# Patient Record
Sex: Female | Born: 1975
Health system: Southern US, Community
[De-identification: ages and names within clinical notes are randomized; demographics above are authoritative.]

## PROBLEM LIST (undated history)

## (undated) DIAGNOSIS — N920 Excessive and frequent menstruation with regular cycle: Secondary | ICD-10-CM

## (undated) DIAGNOSIS — N946 Dysmenorrhea, unspecified: Secondary | ICD-10-CM

## (undated) DIAGNOSIS — F32A Depression, unspecified: Secondary | ICD-10-CM

## (undated) DIAGNOSIS — D219 Benign neoplasm of connective and other soft tissue, unspecified: Secondary | ICD-10-CM

## (undated) DIAGNOSIS — N83209 Unspecified ovarian cyst, unspecified side: Secondary | ICD-10-CM

## (undated) DIAGNOSIS — F329 Major depressive disorder, single episode, unspecified: Secondary | ICD-10-CM

## (undated) DIAGNOSIS — N63 Unspecified lump in unspecified breast: Secondary | ICD-10-CM

## (undated) DIAGNOSIS — R32 Unspecified urinary incontinence: Secondary | ICD-10-CM

## (undated) DIAGNOSIS — F419 Anxiety disorder, unspecified: Secondary | ICD-10-CM

## (undated) DIAGNOSIS — Z87442 Personal history of urinary calculi: Secondary | ICD-10-CM

## (undated) HISTORY — DX: Depression, unspecified: F32.A

## (undated) HISTORY — DX: Unspecified ovarian cyst, unspecified side: N83.209

## (undated) HISTORY — DX: Anxiety disorder, unspecified: F41.9

## (undated) HISTORY — DX: Unspecified urinary incontinence: R32

## (undated) HISTORY — DX: Unspecified lump in unspecified breast: N63.0

## (undated) HISTORY — DX: Benign neoplasm of connective and other soft tissue, unspecified: D21.9

## (undated) HISTORY — PX: OOPHORECTOMY: SHX86

## (undated) HISTORY — PX: BREAST CYST EXCISION: SHX579

## (undated) HISTORY — DX: Excessive and frequent menstruation with regular cycle: N92.0

## (undated) HISTORY — PX: TUBAL LIGATION: SHX77

## (undated) HISTORY — DX: Major depressive disorder, single episode, unspecified: F32.9

## (undated) HISTORY — DX: Dysmenorrhea, unspecified: N94.6

## (undated) HISTORY — PX: APPENDECTOMY: SHX54

---

## 2000-12-21 ENCOUNTER — Encounter: Payer: Self-pay | Admitting: *Deleted

## 2000-12-21 ENCOUNTER — Emergency Department (HOSPITAL_COMMUNITY): Admission: EM | Admit: 2000-12-21 | Discharge: 2000-12-22 | Payer: Self-pay | Admitting: Emergency Medicine

## 2001-05-13 ENCOUNTER — Other Ambulatory Visit: Admission: RE | Admit: 2001-05-13 | Discharge: 2001-05-13 | Payer: Self-pay | Admitting: Obstetrics and Gynecology

## 2005-07-14 ENCOUNTER — Ambulatory Visit (HOSPITAL_COMMUNITY): Admission: RE | Admit: 2005-07-14 | Discharge: 2005-07-14 | Payer: Self-pay | Admitting: General Surgery

## 2008-12-28 ENCOUNTER — Ambulatory Visit: Payer: Self-pay | Admitting: Family Medicine

## 2008-12-28 DIAGNOSIS — R5383 Other fatigue: Secondary | ICD-10-CM

## 2008-12-28 DIAGNOSIS — R5381 Other malaise: Secondary | ICD-10-CM | POA: Insufficient documentation

## 2008-12-28 DIAGNOSIS — F17200 Nicotine dependence, unspecified, uncomplicated: Secondary | ICD-10-CM | POA: Insufficient documentation

## 2008-12-28 DIAGNOSIS — R32 Unspecified urinary incontinence: Secondary | ICD-10-CM | POA: Insufficient documentation

## 2008-12-29 ENCOUNTER — Encounter: Payer: Self-pay | Admitting: Family Medicine

## 2009-01-01 LAB — CONVERTED CEMR LAB
ALT: 11 units/L (ref 0–35)
AST: 10 units/L (ref 0–37)
Albumin: 4.4 g/dL (ref 3.5–5.2)
Alkaline Phosphatase: 41 units/L (ref 39–117)
BUN: 15 mg/dL (ref 6–23)
Basophils Absolute: 0 10*3/uL (ref 0.0–0.1)
Basophils Relative: 1 % (ref 0–1)
Bilirubin, Direct: 0.1 mg/dL (ref 0.0–0.3)
CO2: 24 meq/L (ref 19–32)
Calcium: 9.2 mg/dL (ref 8.4–10.5)
Chloride: 105 meq/L (ref 96–112)
Cholesterol: 188 mg/dL (ref 0–200)
Creatinine, Ser: 0.69 mg/dL (ref 0.40–1.20)
Eosinophils Absolute: 0.2 10*3/uL (ref 0.0–0.7)
Eosinophils Relative: 3 % (ref 0–5)
Glucose, Bld: 84 mg/dL (ref 70–99)
HCT: 40.8 % (ref 36.0–46.0)
HDL: 45 mg/dL (ref >39)
Hemoglobin: 13.2 g/dL (ref 12.0–15.0)
Indirect Bilirubin: 0.5 mg/dL (ref 0.0–0.9)
LDL Cholesterol: 128 mg/dL — ABNORMAL HIGH (ref 0–99)
Lymphocytes Relative: 37 % (ref 12–46)
Lymphs Abs: 2.1 10*3/uL (ref 0.7–4.0)
MCHC: 32.4 g/dL (ref 30.0–36.0)
MCV: 92.5 fL (ref 78.0–100.0)
Monocytes Absolute: 0.4 10*3/uL (ref 0.1–1.0)
Monocytes Relative: 6 % (ref 3–12)
Neutro Abs: 3 10*3/uL (ref 1.7–7.7)
Neutrophils Relative %: 53 % (ref 43–77)
Platelets: 247 10*3/uL (ref 150–400)
Potassium: 4.4 meq/L (ref 3.5–5.3)
RBC: 4.41 M/uL (ref 3.87–5.11)
RDW: 13.3 % (ref 11.5–15.5)
Sodium: 140 meq/L (ref 135–145)
TSH: 0.762 microintl units/mL (ref 0.350–4.500)
Total Bilirubin: 0.6 mg/dL (ref 0.3–1.2)
Total CHOL/HDL Ratio: 4.2
Total Protein: 6.6 g/dL (ref 6.0–8.3)
Triglycerides: 76 mg/dL (ref <150)
VLDL: 15 mg/dL (ref 0–40)
WBC: 5.6 10*3/uL (ref 4.0–10.5)

## 2009-01-04 ENCOUNTER — Ambulatory Visit (HOSPITAL_COMMUNITY): Admission: RE | Admit: 2009-01-04 | Discharge: 2009-01-04 | Payer: Self-pay | Admitting: Family Medicine

## 2009-12-04 ENCOUNTER — Other Ambulatory Visit: Admission: RE | Admit: 2009-12-04 | Discharge: 2009-12-04 | Payer: Self-pay | Admitting: Family Medicine

## 2009-12-04 ENCOUNTER — Ambulatory Visit: Payer: Self-pay | Admitting: Family Medicine

## 2009-12-11 ENCOUNTER — Encounter: Payer: Self-pay | Admitting: Family Medicine

## 2009-12-11 LAB — CONVERTED CEMR LAB: Pap Smear: NEGATIVE

## 2010-04-14 ENCOUNTER — Encounter: Payer: Self-pay | Admitting: General Surgery

## 2010-04-19 ENCOUNTER — Telehealth: Payer: Self-pay | Admitting: Family Medicine
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
## 2010-04-23 NOTE — Letter (Signed)
Summary: Letter  Letter   Imported By: Lind Guest 12/11/2009 11:27:50  _____________________________________________________________________  External Attachment:    Type:   Image     Comment:   External Document

## 2010-04-25 NOTE — Progress Notes (Signed)
  Phone Note Other Incoming   Caller: dr Nobie Putnam Summary of Call: pt has dental abcess, needs antibiotic, will not need fluconazole, has appt next week with the dentist, will send in 10 day supply Initial call taken by: Syliva Overman MD,  April 19, 2010 10:25 AM    New/Updated Medications: AMOXICILLIN 500 MG CAPS (AMOXICILLIN) Take 1 capsule by mouth three times a day Prescriptions: AMOXICILLIN 500 MG CAPS (AMOXICILLIN) Take 1 capsule by mouth three times a day  #30 x 0   Entered and Authorized by:   Syliva Overman MD   Signed by:   Syliva Overman MD on 04/19/2010   Method used:   Electronically to        [facility] (retail)       [address]       [place], [place]  [address]       Ph: [phone]       Fax: [phone]   RxID:   [id number]

## 2011-01-06 ENCOUNTER — Ambulatory Visit (INDEPENDENT_AMBULATORY_CARE_PROVIDER_SITE_OTHER): Payer: 59 | Admitting: Family Medicine

## 2011-01-06 ENCOUNTER — Ambulatory Visit (HOSPITAL_COMMUNITY)
Admission: RE | Admit: 2011-01-06 | Discharge: 2011-01-06 | Disposition: A | Discharge status: Home / Self Care | Payer: 59 | Source: Ambulatory Visit | Attending: Family Medicine | Admitting: Family Medicine

## 2011-01-06 ENCOUNTER — Encounter: Payer: Self-pay | Admitting: Family Medicine

## 2011-01-06 DIAGNOSIS — R079 Chest pain, unspecified: Secondary | ICD-10-CM | POA: Insufficient documentation

## 2011-01-06 DIAGNOSIS — M542 Cervicalgia: Secondary | ICD-10-CM

## 2011-01-06 DIAGNOSIS — M549 Dorsalgia, unspecified: Secondary | ICD-10-CM

## 2011-01-06 DIAGNOSIS — F17200 Nicotine dependence, unspecified, uncomplicated: Secondary | ICD-10-CM

## 2011-01-06 DIAGNOSIS — R32 Unspecified urinary incontinence: Secondary | ICD-10-CM

## 2011-01-06 MED ORDER — PREDNISONE (PAK) 5 MG PO TABS: 5.0000 mg | ORAL_TABLET | ORAL | Status: AC

## 2011-01-06 MED ORDER — IBUPROFEN 600 MG PO TABS: ORAL_TABLET | ORAL | Status: DC

## 2011-01-06 MED ORDER — RANITIDINE HCL 150 MG PO TABS: 150.0000 mg | ORAL_TABLET | Freq: Two times a day (BID) | ORAL | Status: DC

## 2011-01-06 MED ORDER — CYCLOBENZAPRINE HCL 5 MG PO TABS: 5.0000 mg | ORAL_TABLET | Freq: Three times a day (TID) | ORAL | Status: DC | PRN

## 2011-01-06 MED ORDER — TOLTERODINE TARTRATE ER 4 MG PO CP24: 4.0000 mg | ORAL_CAPSULE | Freq: Every day | ORAL | Status: DC

## 2011-01-06 NOTE — Assessment & Plan Note (Signed)
unchnaged °

## 2011-01-06 NOTE — Patient Instructions (Signed)
F/u in 5 months .  You are being treated with anti inflammatories and muscle relaxant for back pain and spasm. X rays are ordered of your neck and upper back .  Med is sent in for incontinence.  Call if no improvement, you may benefit from physical therapy, if pain persists you will need an mRI   Please think about quitting smoking.  This is very important for your health.  Consider setting a quit date, then cutting back or switching brands to prepare to stop.  Also think of the money you will save every day by not smoking.  Quick Tips to Quit Smoking: Fix a date i.e. keep a date in mind from when you would not touch a tobacco product to smoke  Keep yourself busy and block your mind with work loads or reading books or watching movies in malls where smoking is not allowed  Vanish off the things which reminds you about smoking for example match box, or your favorite lighter, or the pipe you used for smoking, or your favorite jeans and shirt with which you used to enjoy smoking, or the club where you used to do smoking  Try to avoid certain people places and incidences where and with whom smoking is a common factor to add on  Praise yourself with some token gifts from the money you saved by stopping smoking  Anti Smoking teams are there to help you. Join their programs  Anti-smoking Gums are there in many medical shops. Try them to quit smoking   Side-effects of Smoking: Disease caused by smoking cigarettes are emphysema, bronchitis, heart failures  Premature death  Cancer is the major side effect of smoking  Heart attacks and strokes are the quick effects of smoking causing sudden death  Some smokers lives end up with limbs amputated  Breathing problem or fast breathing is another side effect of smoking  Due to more intakes of smokes, carbon mono-oxide goes into your brain and other muscles of the body which leads to swelling of the veins and blockage to the air passage to lungs  Carbon  monoxide blocks blood vessels which leads to blockage in the flow of blood to different major body organs like heart lungs and thus leads to attacks and deaths  During pregnancy smoking is very harmful and leads to premature birth of the infant, spontaneous abortions, low weight of the infant during birth  Fat depositions to narrow and blocked blood vessels causing heart attacks  In many cases cigarette smoking caused infertility in men

## 2011-01-06 NOTE — Assessment & Plan Note (Signed)
deteriorted requests med

## 2011-01-06 NOTE — Progress Notes (Signed)
  Subjective:    Patient ID: Anna Gonzalez, female    DOB: 1975-10-09, 35 y.o.   MRN: [id number]  HPI 2 week h/o neck and upper back pain, no aggravating factor noted to bring it on, increased discomfort with upper body movement.Woke in tears 3 days ago Has had this in the past  No plans to quit tobacco at this time.  Still has urge and stress incontinence requests med be renewed   Review of Systems See HPI Denies recent fever or chills. Denies sinus pressure, nasal congestion, ear pain or sore throat. Denies chest congestion, productive cough or wheezing. Denies chest pains, palpitations and leg swelling Denies abdominal pain, nausea, vomiting,diarrhea or constipation.   Denies dysuria.  Denies headaches, seizures, numbness, or tingling. Denies depression, anxiety or insomnia. Denies skin break down or rash.        Objective:   Physical Exam Patient alert and oriented and in no cardiopulmonary distress.Pt in pain  HEENT: No facial asymmetry, EOMI, no sinus tenderness,  oropharynx pink and moist.  Neck supple no adenopathy.  Chest: Clear to auscultation bilaterally.Decreased air entry throughout  CVS: S1, S2 no murmurs, no S3.  ABD: Soft non tender. Bowel sounds normal.  Ext: No edema  MS: Decreased ROM spine,adequate in  shoulders, hips and knees.  Skin: Intact, no ulcerations or rash noted.  Psych: Good eye contact, normal affect. Memory intact not anxious or depressed appearing.  CNS: CN 2-12 intact, power, tone and sensation normal throughout.        Assessment & Plan:

## 2011-01-12 ENCOUNTER — Encounter: Payer: Self-pay | Admitting: Family Medicine

## 2011-01-12 NOTE — Assessment & Plan Note (Signed)
Acute flare, will treat with anti inflammatories and muscle relaxants, films ordered, if no improvement needs Pt and may need MRI scan

## 2011-01-12 NOTE — Assessment & Plan Note (Addendum)
Acute episode of pain , symptomatic treatment only at this time as well as films

## 2011-01-14 ENCOUNTER — Telehealth: Payer: Self-pay | Admitting: Family Medicine

## 2011-01-14 NOTE — Telephone Encounter (Signed)
pls advise mild curving/scoliosis of mid back, neck x ray looked normal(I tried to call, no answer)

## 2011-01-16 ENCOUNTER — Ambulatory Visit: Payer: Self-pay | Admitting: Family Medicine

## 2011-01-17 NOTE — Telephone Encounter (Signed)
Patient aware.

## 2011-06-12 ENCOUNTER — Encounter: Payer: Self-pay | Admitting: Family Medicine

## 2011-06-12 ENCOUNTER — Encounter: Payer: 59 | Admitting: Family Medicine

## 2011-11-28 ENCOUNTER — Encounter (HOSPITAL_COMMUNITY): Payer: Self-pay

## 2011-11-28 ENCOUNTER — Emergency Department (HOSPITAL_COMMUNITY)
Admission: EM | Admit: 2011-11-28 | Discharge: 2011-11-28 | Disposition: A | Discharge status: Home / Self Care | Payer: 59 | Attending: Emergency Medicine | Admitting: Emergency Medicine

## 2011-11-28 DIAGNOSIS — M542 Cervicalgia: Secondary | ICD-10-CM | POA: Insufficient documentation

## 2011-11-28 DIAGNOSIS — F17200 Nicotine dependence, unspecified, uncomplicated: Secondary | ICD-10-CM | POA: Insufficient documentation

## 2011-11-28 DIAGNOSIS — M62838 Other muscle spasm: Secondary | ICD-10-CM | POA: Insufficient documentation

## 2011-11-28 MED ORDER — IBUPROFEN 600 MG PO TABS: 600.0000 mg | ORAL_TABLET | Freq: Four times a day (QID) | ORAL | Status: AC | PRN

## 2011-11-28 MED ORDER — HYDROCODONE-ACETAMINOPHEN 5-325 MG PO TABS: 1.0000 | ORAL_TABLET | ORAL | Status: AC | PRN

## 2011-11-28 MED ORDER — CYCLOBENZAPRINE HCL 5 MG PO TABS: 5.0000 mg | ORAL_TABLET | Freq: Three times a day (TID) | ORAL | Status: AC | PRN

## 2011-11-28 NOTE — ED Notes (Signed)
Pt states she was sitting at her desk, stretched and left side of neck popped

## 2011-11-28 NOTE — ED Provider Notes (Signed)
History     CSN: [id number]  Arrival date & time 11/28/11  0913   First MD Initiated Contact with Patient 11/28/11 [phone]      Chief Complaint  Patient presents with  . Neck Pain    (Consider location/radiation/quality/duration/timing/severity/associated sxs/prior treatment) HPI Comments: Anna Gonzalez presents with sudden onset of left sided neck pain and muscle spasm after stretching at her desk this morning with her hands over her head.  She does report a history of chronic intermittent pain in her neck and felt for the past several days that she was developing another episode of spasm.  She denies pain, numbness or tingling in her upper extremities,  But she does describe a slightly "funny" sensation in her left forearm.  She denies weakness.  She has tried no medications prior to arrival.  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  Movement makes her pain worse,  Especially attempts to rotate her head over her right shoulder.  The history is provided by the patient.    History reviewed. No pertinent past medical history.  Past Surgical History  Procedure Date  . Oophorectomy   . Appendectomy   . Breast cyst excision     Family History  Problem Relation Age of Onset  . Cancer Mother     History  Substance Use Topics  . Smoking status: Current Everyday Smoker -- 1.0 packs/day  . Smokeless tobacco: Not on file  . Alcohol Use: Yes     occasionally    OB History    Grav Para Term Preterm Abortions TAB SAB Ect Mult Living                  Review of Systems  Constitutional: Negative for fever.  HENT: Positive for neck pain and neck stiffness.   Respiratory: Negative for shortness of breath.   Cardiovascular: Negative for chest pain.  Skin: Negative for rash and wound.  Neurological: Negative for weakness, light-headedness and numbness.    Allergies  Review of patient's allergies indicates no known allergies.  Home Medications    Current Outpatient Rx  Name Route Sig Dispense Refill  . BAYER BACK & BODY PAIN EX ST PO Oral Take 2 tablets by mouth daily as needed. Pain.    . CYCLOBENZAPRINE HCL 5 MG PO TABS Oral Take 1 tablet (5 mg total) by mouth 3 (three) times daily as needed for muscle spasms. 15 tablet 0  . HYDROCODONE-ACETAMINOPHEN 5-325 MG PO TABS Oral Take 1 tablet by mouth every 4 (four) hours as needed for pain. 15 tablet 0  . IBUPROFEN 600 MG PO TABS Oral Take 1 tablet (600 mg total) by mouth every 6 (six) hours as needed for pain. 15 tablet 0    BP 119/70  Pulse 73  Temp 98.1 F (36.7 C) (Oral)  Resp 21  Ht 5\' 5"  (1.651 m)  Wt 130 lb (58.968 kg)  BMI 21.63 kg/m2  SpO2 100%  LMP 11/21/2011  Physical Exam  Nursing note and vitals reviewed. Constitutional: She appears well-developed and well-nourished.  HENT:  Head: Normocephalic.  Eyes: Conjunctivae are normal.  Neck: Muscular tenderness present. No spinous process tenderness present. No rigidity. Decreased range of motion present. No edema present.    Cardiovascular: Normal rate and intact distal pulses.   Pulmonary/Chest: Effort normal.  Abdominal: Soft. Bowel sounds are normal. She exhibits no distension and no mass.  Musculoskeletal: She exhibits no edema.  Lumbar back: She exhibits tenderness. She exhibits no swelling, no edema and no spasm.  Neurological: She is alert. She has normal strength. She displays no atrophy and no tremor. No sensory deficit. She exhibits normal muscle tone. Gait normal.  Reflex Scores:      Bicep reflexes are 2+ on the right side and 2+ on the left side.      No strength deficit noted in upper extremities.  Equal grip strength.  Skin: Skin is warm and dry.  Psychiatric: She has a normal mood and affect.    ED Course  Procedures (including critical care time)  Labs Reviewed - No data to display No results found.   1. Cervical paraspinous muscle spasm       MDM  Patient prescribed Flexeril,  ibuprofen and hydrocodone.  Caution given regarding sedating effects of hydrocodone.  She is also encouraged to use a heating pad 15 minutes 4 times daily followed by gentle range of motion.  Encouraged to followup with her PCP for recheck if not improved over the next week.  Also encouraged to get prompt recheck if she develops any weakness in her upper extremities.  Prior x-rays reviewed including normal complete C-spine with flexion and extension x-rays 12/12.    No neuro deficit on exam or by history to suggest emergent or surgical presentation.  Also discussed worsened sx that should prompt immediate re-evaluation including distal weakness, bowel/bladder retention/incontinence.              Burgess Amor, PA 11/28/11 0955  Burgess Amor, PA 11/28/11 [phone]

## 2011-11-28 NOTE — ED Provider Notes (Signed)
Medical screening examination/treatment/procedure(s) were performed by non-physician practitioner and as supervising physician I was immediately available for consultation/collaboration.   Joya Gaskins, MD 11/28/11 1409

## 2012-04-07 ENCOUNTER — Other Ambulatory Visit: Payer: Self-pay | Admitting: Family Medicine

## 2013-01-31 ENCOUNTER — Encounter (HOSPITAL_COMMUNITY): Payer: Self-pay | Admitting: Emergency Medicine

## 2013-01-31 ENCOUNTER — Emergency Department (HOSPITAL_COMMUNITY)
Admission: EM | Admit: 2013-01-31 | Discharge: 2013-01-31 | Disposition: A | Discharge status: Home / Self Care | Payer: 59 | Attending: Emergency Medicine | Admitting: Emergency Medicine

## 2013-01-31 DIAGNOSIS — F17200 Nicotine dependence, unspecified, uncomplicated: Secondary | ICD-10-CM | POA: Insufficient documentation

## 2013-01-31 DIAGNOSIS — M436 Torticollis: Secondary | ICD-10-CM | POA: Insufficient documentation

## 2013-01-31 MED ORDER — METHOCARBAMOL 500 MG PO TABS: 1000.0000 mg | ORAL_TABLET | Freq: Four times a day (QID) | ORAL | Status: DC

## 2013-01-31 MED ORDER — HYDROCODONE-ACETAMINOPHEN 5-325 MG PO TABS: 1.0000 | ORAL_TABLET | ORAL | Status: DC | PRN

## 2013-01-31 MED ORDER — HYDROCODONE-ACETAMINOPHEN 5-325 MG PO TABS
1.0000 | ORAL_TABLET | Freq: Once | ORAL | Status: AC
  Administered 2013-01-31: 1 via ORAL
  Filled 2013-01-31: qty 1

## 2013-01-31 MED ORDER — METHOCARBAMOL 500 MG PO TABS
1000.0000 mg | ORAL_TABLET | Freq: Once | ORAL | Status: AC
  Administered 2013-01-31: 1000 mg via ORAL
  Filled 2013-01-31: qty 2

## 2013-01-31 MED ORDER — METHOCARBAMOL 500 MG PO TABS: 1000.0000 mg | ORAL_TABLET | Freq: Four times a day (QID) | ORAL | Status: AC

## 2013-01-31 NOTE — ED Notes (Signed)
Pain in neck and rt  Shoulder Onset this am when stretching.

## 2013-02-02 NOTE — ED Provider Notes (Signed)
CSN: [id number]     Arrival date & time 01/31/13  1034 History   First MD Initiated Contact with Patient 01/31/13 1123     Chief Complaint  Patient presents with  . Neck Pain   (Consider location/radiation/quality/duration/timing/severity/associated sxs/prior Treatment) HPI Comments: [name] is a 37 y.o. Female presenting with sudden onset of pain and muscle spasm of her right neck and upper shoulder.  She stood up from her desk to stretch and had sudden onset of symptoms.  She does have a history of intermittent similar symptoms which she blames on stress and is usually relieved by muscle relaxers.  She denies weakness, numbness or tingling in her upper extremities.  Additionally she denies dizziness and shortness of breath, fevers, uri type symptoms,  Denies headache and visual changes.     The history is provided by the patient.    History reviewed. No pertinent past medical history. Past Surgical History  Procedure Laterality Date  . Oophorectomy    . Appendectomy    . Breast cyst excision    . Cesarean section     Family History  Problem Relation Age of Onset  . Cancer Mother    History  Substance Use Topics  . Smoking status: Current Every Day Smoker -- 1.00 packs/day  . Smokeless tobacco: Not on file  . Alcohol Use: Yes     Comment: occasionally   OB History   Grav Para Term Preterm Abortions TAB SAB Ect Mult Living                 Review of Systems  Constitutional: Negative for fever and chills.  Eyes: Negative for visual disturbance.  Respiratory: Negative for shortness of breath.   Cardiovascular: Negative for chest pain and leg swelling.  Gastrointestinal: Negative for abdominal pain, constipation and abdominal distention.  Genitourinary: Negative for dysuria, urgency, frequency, flank pain and difficulty urinating.  Musculoskeletal: Positive for neck pain and neck stiffness. Negative for gait problem and joint swelling.  Skin: Negative for rash.   Neurological: Negative for weakness and numbness.    Allergies  Review of patient's allergies indicates no known allergies.  Home Medications   Current Outpatient Rx  Name  Route  Sig  Dispense  Refill  . Aspirin-Caffeine (BAYER BACK & BODY PAIN EX ST PO)   Oral   Take 2 tablets by mouth daily as needed. Pain.         Marland Kitchen HYDROcodone-acetaminophen (NORCO/VICODIN) 5-325 MG per tablet   Oral   Take 1 tablet by mouth every 4 (four) hours as needed for moderate pain.   20 tablet   0   . methocarbamol (ROBAXIN) 500 MG tablet   Oral   Take 2 tablets (1,000 mg total) by mouth 4 (four) times daily.   40 tablet   0    BP 114/63  Pulse 73  Temp(Src) 98 F (36.7 C) (Oral)  Resp 20  Ht 5\' 4"  (1.626 m)  Wt 125 lb (56.7 kg)  BMI 21.45 kg/m2  SpO2 100%  LMP 01/20/2013 Physical Exam  Constitutional: She appears well-developed and well-nourished.  HENT:  Head: Normocephalic and atraumatic.  Eyes: EOM are normal. Pupils are equal, round, and reactive to light.  Neck: Muscular tenderness present. No spinous process tenderness present. Decreased range of motion present. No tracheal deviation present.  Pt sitting very erect,  relunctant to move head.  She can rotate over left shoulder with mild discomfort.  Increased pain with rightward rotation and reduced  ROM.  Equal grip strength.  Radial pulses intact.  ttp right trapezius with spasm.  Cardiovascular:  Pulses equal bilaterally  Pulmonary/Chest: No stridor.  Neurological: She is alert. She has normal strength. She displays normal reflexes. No sensory deficit.  Reflex Scores:      Bicep reflexes are 2+ on the right side and 2+ on the left side. Equal strength  Skin: Skin is warm and dry.  Psychiatric: She has a normal mood and affect.    ED Course  Procedures (including critical care time) Labs Review Labs Reviewed - No data to display Imaging Review No results found.  EKG Interpretation   None       MDM   1.  Torticollis    Pt encouraged heating pad qid followed by gentle stretch/ROM.  Robaxin,  Hydrocodone prescribed.  F/u with pcp if not improving over the next several days.  The patient appears reasonably screened and/or stabilized for discharge and I doubt any other medical condition or other [facility] requiring further screening, evaluation, or treatment in the ED at this time prior to discharge.     Burgess Amor, PA-C 02/02/13 1718

## 2013-02-03 NOTE — ED Provider Notes (Signed)
Medical screening examination/treatment/procedure(s) were performed by non-physician practitioner and as supervising physician I was immediately available for consultation/collaboration.  EKG Interpretation   None        Yocelin Vanlue, MD 02/03/13 1042 

## 2013-08-12 ENCOUNTER — Other Ambulatory Visit (HOSPITAL_COMMUNITY)
Admission: RE | Admit: 2013-08-12 | Discharge: 2013-08-12 | Disposition: A | Discharge status: Home / Self Care | Payer: 59 | Source: Ambulatory Visit | Attending: Adult Health | Admitting: Adult Health

## 2013-08-12 ENCOUNTER — Encounter: Payer: Self-pay | Admitting: Adult Health

## 2013-08-12 ENCOUNTER — Ambulatory Visit (INDEPENDENT_AMBULATORY_CARE_PROVIDER_SITE_OTHER): Payer: 59 | Admitting: Adult Health

## 2013-08-12 VITALS — BP 122/76 | HR 76 | Ht 63.5 in | Wt 133.0 lb

## 2013-08-12 DIAGNOSIS — N920 Excessive and frequent menstruation with regular cycle: Secondary | ICD-10-CM

## 2013-08-12 DIAGNOSIS — Z01419 Encounter for gynecological examination (general) (routine) without abnormal findings: Secondary | ICD-10-CM

## 2013-08-12 DIAGNOSIS — Z113 Encounter for screening for infections with a predominantly sexual mode of transmission: Secondary | ICD-10-CM | POA: Insufficient documentation

## 2013-08-12 DIAGNOSIS — Z1212 Encounter for screening for malignant neoplasm of rectum: Secondary | ICD-10-CM

## 2013-08-12 DIAGNOSIS — N946 Dysmenorrhea, unspecified: Secondary | ICD-10-CM

## 2013-08-12 DIAGNOSIS — N63 Unspecified lump in unspecified breast: Secondary | ICD-10-CM

## 2013-08-12 DIAGNOSIS — R32 Unspecified urinary incontinence: Secondary | ICD-10-CM

## 2013-08-12 DIAGNOSIS — Z1151 Encounter for screening for human papillomavirus (HPV): Secondary | ICD-10-CM | POA: Insufficient documentation

## 2013-08-12 HISTORY — DX: Unspecified urinary incontinence: R32

## 2013-08-12 HISTORY — DX: Dysmenorrhea, unspecified: N94.6

## 2013-08-12 HISTORY — DX: Unspecified lump in unspecified breast: N63.0

## 2013-08-12 HISTORY — DX: Excessive and frequent menstruation with regular cycle: N92.0

## 2013-08-12 LAB — HEMOCCULT GUIAC POC 1CARD (OFFICE): Fecal Occult Blood, POC: NEGATIVE

## 2013-08-12 NOTE — Patient Instructions (Signed)
Urinary Incontinence Urinary incontinence is the involuntary loss of urine from your bladder. CAUSES  There are many causes of urinary incontinence. They include:  Medicines.  Infections.  Prostatic enlargement, leading to overflow of urine from your bladder.  Surgery.  Neurological diseases.  Emotional factors. SIGNS AND SYMPTOMS Urinary Incontinence can be divided into four types: 1. Urge incontinence. Urge incontinence is the involuntary loss of urine before you have the opportunity to go to the bathroom. There is a sudden urge to void but not enough time to reach a bathroom. 2. Stress incontinence. Stress incontinence is the sudden loss of urine with any activity that forces urine to pass. It is commonly caused by anatomical changes to the pelvis and sphincter areas of your body. 3. Overflow incontinence. Overflow incontinence is the loss of urine from an obstructed opening to your bladder. This results in a backup of urine and a resultant buildup of pressure within the bladder. When the pressure within the bladder exceeds the closing pressure of the sphincter, the urine overflows, which causes incontinence, similar to water overflowing a dam. 4. Total incontinence. Total incontinence is the loss of urine as a result of the inability to store urine within your bladder. DIAGNOSIS  Evaluating the cause of incontinence may require:  A thorough and complete medical and obstetric history.  A complete physical exam.  Laboratory tests such as a urine culture and sensitivities. When additional tests are indicated, they can include:  An ultrasound exam.  Kidney and bladder X-rays.  Cystoscopy. This is an exam of the bladder using a narrow scope.  Urodynamic testing to test the nerve function to the bladder and sphincter areas. TREATMENT  Treatment for urinary incontinence depends on the cause:  For urge incontinence caused by a bacterial infection, antibiotics will be prescribed.  If the urge incontinence is related to medicines you take, your health care provider may have you change the medicine.  For stress incontinence, surgery to re-establish anatomical support to the bladder or sphincter, or both, will often correct the condition.  For overflow incontinence caused by an enlarged prostate, an operation to open the channel through the enlarged prostate will allow the flow of urine out of the bladder. In women with fibroids, a hysterectomy may be recommended.  For total incontinence, surgery on your urinary sphincter may help. An artificial urinary sphincter (an inflatable cuff placed around the urethra) may be required. In women who have developed a hole-like passage between their bladder and vagina (vesicovaginal fistula), surgery to close the fistula often is required. HOME CARE INSTRUCTIONS  Normal daily hygiene and the use of pads or adult diapers that are changed regularly will help prevent odors and skin damage.  Avoid caffeine. It can overstimulate your bladder.  Use the bathroom regularly. Try about every 2 3 hours to go to the bathroom, even if you do not feel the need to do so. Take time to empty your bladder completely. After urinating, wait a minute. Then try to urinate again.  For causes involving nerve dysfunction, keep a log of the medicines you take and a journal of the times you go to the bathroom. SEEK MEDICAL CARE IF:  You experience worsening of pain instead of improvement in pain after your procedure.  Your incontinence becomes worse instead of better. SEE IMMEDIATE MEDICAL CARE IF:  You experience fever or shaking chills.  You are unable to pass your urine.  You have redness spreading into your groin or down into your thighs. MAKE   SURE YOU:   Understand these instructions.   Will watch your condition.  Will get help right away if you are not doing well or get worse. Document Released: 04/17/2004 Document Revised: 12/29/2012 Document  Reviewed: 08/17/2012 [facility] Patient Information 2014 [place], [place]. Breast Cyst A breast cyst is a sac in the breast that is filled with fluid. Breast cysts are common in women. Women can have one or many cysts. When the breasts contain many cysts, it is usually due to a noncancerous (benign) condition called fibrocystic change. These lumps form under the influence of female hormones (estrogen and progesterone). The lumps are most often located in the upper, outer portion of the breast. They are often more swollen, painful, and tender before your period starts. They usually disappear after menopause, unless you are on hormone therapy.  There are several types of cysts:  Macrocyst. This is a cyst that is about 2 in. (5.1 cm) in diameter.   Microcyst. This is a tiny cyst that you cannot feel but can be seen with a mammogram or an ultrasound.   Galactocele. This is a cyst containing milk that may develop if you suddenly stop breastfeeding.   Sebaceous cyst of the skin. This type of cyst is not in the breast tissue itself. Breast cysts do not increase your risk of breast cancer. However, they must be monitored closely because they can be cancerous.  CAUSES  It is not known exactly what causes a breast cyst to form. Possible causes include:  An overgrowth of milk glands and connective tissue in the breast can block the milk glands, causing them to fill with fluid.   Scar tissue in the breast from previous surgery may block the glands, causing a cyst.  RISK FACTORS Estrogen may influence the development of a breast cyst.  SIGNS AND SYMPTOMS   Feeling a smooth, round, soft lump (like a grape) in the breast that is easily moveable.   Breast discomfort or pain.  Increase in size of the lump before your menstrual period and decrease in its size after your menstrual period.  DIAGNOSIS  A cyst can be felt during a physical exam by your health care provider. A breast X-ray exam  (mammogram) and ultrasonography will be done to confirm the diagnosis. Fluid may be removed from the cyst with a needle (fine needle aspiration) to make sure the cyst is not cancerous.  TREATMENT  Treatment may not be necessary. Your health care provider may monitor the cyst to see if it goes away on its own. If treatment is needed, it may include:  Hormone treatment.   Needle aspiration. There is a chance of the cyst coming back after aspiration.   Surgery to remove the whole cyst.  HOME CARE INSTRUCTIONS   Keep all follow-up appointments with your health care provider.  See your health care provider regularly:  Get a yearly exam by your health care provider.  Have a clinical breast exam by a health care provider every 1 3 years if you are 58 40 years of age. After age 59 years, you should have the exam every year.   Get mammogram tests as directed by your health care provider.   Understand the normal appearance and feel of your breasts and perform breast self-exams.   Only take over-the-counter or prescription medicines as directed by your health care provider.   Wear a supportive bra, especially when exercising.   Avoid caffeine.   Reduce your salt intake, especially before your menstrual  period. Too much salt can cause fluid retention, breast swelling, and discomfort.  SEEK MEDICAL CARE IF:   You feel, or think you feel, a lump in your breast.   You notice that both breasts look or feel different than usual.   Your breast is still causing pain after your menstrual period is over.   You need medicine for breast pain and swelling that occurs with your menstrual period.  SEEK IMMEDIATE MEDICAL CARE IF:   You have severe pain, tenderness, redness, or warmth in your breast.   You have nipple discharge or bleeding.   Your breast lump becomes hard and painful.   You find new lumps or bumps that were not there before.   You feel lumps in your armpit  (axilla).   You notice dimpling or wrinkling of the breast or nipple.   You have a fever.  MAKE SURE YOU:  Understand these instructions.  Will watch your condition.  Will get help right away if you are not doing well or get worse. Document Released: 03/10/2005 Document Revised: 11/10/2012 Document Reviewed: 10/07/2012 [facility] Patient Information 2014 [place], [place]. Dysmenorrhea Menstrual cramps (dysmenorrhea) are caused by the muscles of the uterus tightening (contracting) during a menstrual period. For some women, this discomfort is merely bothersome. For others, dysmenorrhea can be severe enough to interfere with everyday activities for a few days each month. Primary dysmenorrhea is menstrual cramps that last a couple of days when you start having menstrual periods or soon after. This often begins after a teenager starts having her period. As a woman gets older or has a baby, the cramps will usually lessen or disappear. Secondary dysmenorrhea begins later in life, lasts longer, and the pain may be stronger than primary dysmenorrhea. The pain may start before the period and last a few days after the period.  CAUSES  Dysmenorrhea is usually caused by an underlying problem, such as:  The tissue lining the uterus grows outside of the uterus in other areas of the body (endometriosis).  The endometrial tissue, which normally lines the uterus, is found in or grows into the muscular walls of the uterus (adenomyosis).  The pelvic blood vessels are engorged with blood just before the menstrual period (pelvic congestive syndrome).  Overgrowth of cells (polyps) in the lining of the uterus or cervix.  Falling down of the uterus (prolapse) because of loose or stretched ligaments.  Depression.  Bladder problems, infection, or inflammation.  Problems with the intestine, a tumor, or irritable bowel syndrome.  Cancer of the female organs or bladder.  A severely tipped uterus.  A very  tight opening or closed cervix.  Noncancerous tumors of the uterus (fibroids).  Pelvic inflammatory disease (PID).  Pelvic scarring (adhesions) from a previous surgery.  Ovarian cyst.  An intrauterine device (IUD) used for birth control. RISK FACTORS You may be at greater risk of dysmenorrhea if:  You are younger than age 17.  You started puberty early.  You have irregular or heavy bleeding.  You have never given birth.  You have a family history of this problem.  You are a smoker. SIGNS AND SYMPTOMS   Cramping or throbbing pain in your lower abdomen.  Headaches.  Lower back pain.  Nausea or vomiting.  Diarrhea.  Sweating or dizziness.  Loose stools. DIAGNOSIS  A diagnosis is based on your history, symptoms, physical exam, diagnostic tests, or procedures. Diagnostic tests or procedures may include:  Blood tests.  Ultrasonography.  An examination of the lining of  the uterus (dilation and curettage, D&C).  An examination inside your abdomen or pelvis with a scope (laparoscopy).  X-rays.  CT scan.  MRI.  An examination inside the bladder with a scope (cystoscopy).  An examination inside the intestine or stomach with a scope (colonoscopy, gastroscopy). TREATMENT  Treatment depends on the cause of the dysmenorrhea. Treatment may include:  Pain medicine prescribed by your health care provider.  Birth control pills or an IUD with progesterone hormone in it.  Hormone replacement therapy.  Nonsteroidal anti-inflammatory drugs (NSAIDs). These may help stop the production of prostaglandins.  Surgery to remove adhesions, endometriosis, ovarian cyst, or fibroids.  Removal of the uterus (hysterectomy).  Progesterone shots to stop the menstrual period.  Cutting the nerves on the sacrum that go to the female organs (presacral neurectomy).  Electric current to the sacral nerves (sacral nerve stimulation).  Antidepressant medicine.  Psychiatric  therapy, counseling, or group therapy.  Exercise and physical therapy.  Meditation and yoga therapy.  Acupuncture. HOME CARE INSTRUCTIONS   Only take over-the-counter or prescription medicines as directed by your health care provider.  Place a heating pad or hot water bottle on your lower back or abdomen. Do not sleep with the heating pad.  Use aerobic exercises, walking, swimming, biking, and other exercises to help lessen the cramping.  Massage to the lower back or abdomen may help.  Stop smoking.  Avoid alcohol and caffeine. SEEK MEDICAL CARE IF:   Your pain does not get better with medicine.  You have pain with sexual intercourse.  Your pain increases and is not controlled with medicines.  You have abnormal vaginal bleeding with your period.  You develop nausea or vomiting with your period that is not controlled with medicine. SEEK IMMEDIATE MEDICAL CARE IF:  You pass out.  Document Released: 03/10/2005 Document Revised: 11/10/2012 Document Reviewed: 08/26/2012 [facility] Patient Information 2014 [place]. Menorrhagia Menorrhagia is the medical term for when your menstrual periods are heavy or last longer than usual. With menorrhagia, every period you have may cause enough blood loss and cramping that you are unable to maintain your usual activities. CAUSES  In some cases, the cause of heavy periods is unknown, but a number of conditions may cause menorrhagia. Common causes include:  A problem with the hormone-producing thyroid gland (hypothyroid).  Noncancerous growths in the uterus (polyps or fibroids).  An imbalance of the estrogen and progesterone hormones.  One of your ovaries not releasing an egg during one or more months.  Side effects of having an intrauterine device (IUD).  Side effects of some medicines, such as anti-inflammatory medicines or blood thinners.  A bleeding disorder that stops your blood from clotting normally. SIGNS AND SYMPTOMS    During a normal period, bleeding lasts between 4 and 8 days. Signs that your periods are too heavy include:  You routinely have to change your pad or tampon every 1 or 2 hours because it is completely soaked.  You pass blood clots larger than 1 inch (2.5 cm) in size.  You have bleeding for more than 7 days.  You need to use pads and tampons at the same time because of heavy bleeding.  You need to wake up to change your pads or tampons during the night.  You have symptoms of anemia, such as tiredness, fatigue, or shortness of breath. DIAGNOSIS  Your health care provider will perform a physical exam and ask you questions about your symptoms and menstrual history. Other tests may be ordered based  on what the health care provider finds during the exam. These tests can include:  Blood tests To check if you are pregnant or have hormonal changes, a bleeding or thyroid disorder, low iron levels (anemia), or other problems.  Endometrial biopsy Your health care provider takes a sample of tissue from the inside of your uterus to be examined under a microscope.  Pelvic ultrasound This test uses sound waves to make a picture of your uterus, ovaries, and vagina. The pictures can show if you have fibroids or other growths.  Hysteroscopy For this test, your health care provider will use a small telescope to look inside your uterus. Based on the results of your initial tests, your health care provider may recommend further testing. TREATMENT  Treatment may not be needed. If it is needed, your health care provider may recommend treatment with one or more medicines first. If these do not reduce bleeding enough, a surgical treatment might be an option. The best treatment for you will depend on:   Whether you need to prevent pregnancy.  Your desire to have children in the future.  The cause and severity of your bleeding.  Your opinion and personal preference.  Medicines for menorrhagia may  include:  Birth control methods that use hormones These include the pill, skin patch, vaginal ring, shots that you get every 3 months, hormonal IUD, and implant. These treatments reduce bleeding during your menstrual period.  Medicines that thicken blood and slow bleeding.  Medicines that reduce swelling, such as ibuprofen.  Medicines that contain a synthetic hormone called progestin.   Medicines that make the ovaries stop working for a short time.  You may need surgical treatment for menorrhagia if the medicines are unsuccessful. Treatment options include:  Dilation and curettage (D&C) In this procedure, your health care provider opens (dilates) your cervix and then scrapes or suctions tissue from the lining of your uterus to reduce menstrual bleeding.  Operative hysteroscopy This procedure uses a tiny tube with a light (hysteroscope) to view your uterine cavity and can help in the surgical removal of a polyp that may be causing heavy periods.  Endometrial ablation Through various techniques, your health care provider permanently destroys the entire lining of your uterus (endometrium). After endometrial ablation, most women have little or no menstrual flow. Endometrial ablation reduces your ability to become pregnant.  Endometrial resection This surgical procedure uses an electrosurgical wire loop to remove the lining of the uterus. This procedure also reduces your ability to become pregnant.  Hysterectomy Surgical removal of the uterus and cervix is a permanent procedure that stops menstrual periods. Pregnancy is not possible after a hysterectomy. This procedure requires anesthesia and hospitalization. HOME CARE INSTRUCTIONS   Only take over-the-counter or prescription medicines as directed by your health care provider. Take prescribed medicines exactly as directed. Do not change or switch medicines without consulting your health care provider.  Take any prescribed iron pills exactly  as directed by your health care provider. Long-term heavy bleeding may result in low iron levels. Iron pills help replace the iron your body lost from heavy bleeding. Iron may cause constipation. If this becomes a problem, increase the bran, fruits, and roughage in your diet.  Do not take aspirin or medicines that contain aspirin 1 week before or during your menstrual period. Aspirin may make the bleeding worse.  If you need to change your sanitary pad or tampon more than once every 2 hours, stay in bed and rest as much as possible  until the bleeding stops.  Eat well-balanced meals. Eat foods high in iron. Examples are leafy green vegetables, meat, liver, eggs, and whole grain breads and cereals. Do not try to lose weight until the abnormal bleeding has stopped and your blood iron level is back to normal. SEEK MEDICAL CARE IF:   You soak through a pad or tampon every 1 or 2 hours, and this happens every time you have a period.  You need to use pads and tampons at the same time because you are bleeding so much.  You need to change your pad or tampon during the night.  You have a period that lasts for more than 8 days.  You pass clots bigger than 1 inch wide.  You have irregular periods that happen more or less often than once a month.  You feel dizzy or faint.  You feel very weak or tired.  You feel short of breath or feel your heart is beating too fast when you exercise.  You have nausea and vomiting or diarrhea while you are taking your medicine.  You have any problems that may be related to the medicine you are taking. SEEK IMMEDIATE MEDICAL CARE IF:   You soak through 4 or more pads or tampons in 2 hours.  You have any bleeding while you are pregnant. MAKE SURE YOU:   Understand these instructions.  Will watch your condition.  Will get help right away if you are not doing well or get worse. Document Released: 03/10/2005 Document Revised: 12/29/2012 Document Reviewed:  08/29/2012 [facility] Patient Information 2014 [place]. Return in 2 weeks for [place] Mammogram 6/3 at 10:45 at [facility]

## 2013-08-12 NOTE — Progress Notes (Signed)
Patient ID: Anna Gonzalez, female   DOB: 1975-11-17, 38 y.o.   MRN: [id number] History of Present Illness: Anna Gonzalez is 38 year old white female in for a pap and physical.She has noticed nodule left breast and is complaining of heavy, clotty periods with cramps.She also has mixed urinary incontinence and some times loses urine with orgasm.   Current Medications, Allergies, Past Medical History, Past Surgical History, Family History and Social History were reviewed in [employer] record.     Review of Systems: Patient denies any headaches, blurred vision, shortness of breath, chest pain, abdominal pain, problems with bowel movements,  or intercourse. No joint pain or mood swings, quit smoking when she turned 38.See HPI for positives.    Physical Exam:BP 122/76  Pulse 76  Ht 5' 3.5" (1.613 m)  Wt 133 lb (60.328 kg)  BMI 23.19 kg/m2  LMP 07/13/2013 General:  Well developed, well nourished, no acute distress Skin:  Warm and dry Neck:  Midline trachea, normal thyroid Lungs; Clear to auscultation bilaterally Breast:  No dominant palpable mass, retraction, or nipple discharge on right but has irregularities, on the left there is no retraction or nipplre discharge but there is a small nodule at 4 0 clock that is tender and she has a history of breast cysts since age 60 and has several drained. Cardiovascular: Regular rate and rhythm Abdomen:  Soft, non tender, no hepatosplenomegaly Pelvic:  External genitalia is normal in appearance.  The vagina is normal in appearance.   The cervix is bulbous.Pap with HPV and GC/CHL performed.  Uterus is felt to be normal size, shape, and contour.  No  adnexal masses or tenderness noted. Rectal: Good sphincter tone, no polyps, or hemorrhoids felt.  Hemoccult negative. Extremities:  No swelling or varicosities noted Psych:  No mood changes, alert and cooperative,seems happy Discussed ablation as option, will get [place] first to assess  uterus.  Impression: Yearly gyn exam Left  Breast nodule Menorrhagia Dysmenorrhea UI-mixed    Plan: Diagnostic bilateral mammogram 6/3 at 10:45 am at [facility] Return in 2 weeks for [place] and see me Review handouts on menorrhagia and dysmenorrhea and breast cysts and ablation Physical in 1 year

## 2013-08-23 ENCOUNTER — Other Ambulatory Visit: Payer: Self-pay | Admitting: Adult Health

## 2013-08-23 DIAGNOSIS — N946 Dysmenorrhea, unspecified: Secondary | ICD-10-CM

## 2013-08-23 DIAGNOSIS — N920 Excessive and frequent menstruation with regular cycle: Secondary | ICD-10-CM

## 2013-08-24 ENCOUNTER — Ambulatory Visit (HOSPITAL_COMMUNITY)
Admission: RE | Admit: 2013-08-24 | Discharge: 2013-08-24 | Disposition: A | Discharge status: Home / Self Care | Payer: 59 | Source: Ambulatory Visit | Attending: Adult Health | Admitting: Adult Health

## 2013-08-24 ENCOUNTER — Telehealth: Payer: Self-pay | Admitting: Adult Health

## 2013-08-24 ENCOUNTER — Other Ambulatory Visit: Payer: Self-pay | Admitting: Adult Health

## 2013-08-24 DIAGNOSIS — N6009 Solitary cyst of unspecified breast: Secondary | ICD-10-CM | POA: Insufficient documentation

## 2013-08-24 DIAGNOSIS — N63 Unspecified lump in unspecified breast: Secondary | ICD-10-CM

## 2013-08-24 NOTE — Telephone Encounter (Signed)
Pt aware of mammogram and [place] and pap

## 2013-08-26 ENCOUNTER — Ambulatory Visit (INDEPENDENT_AMBULATORY_CARE_PROVIDER_SITE_OTHER): Payer: 59

## 2013-08-26 ENCOUNTER — Ambulatory Visit (INDEPENDENT_AMBULATORY_CARE_PROVIDER_SITE_OTHER): Payer: 59 | Admitting: Adult Health

## 2013-08-26 ENCOUNTER — Encounter: Payer: Self-pay | Admitting: Adult Health

## 2013-08-26 VITALS — BP 102/68 | Ht 63.5 in | Wt 132.0 lb

## 2013-08-26 DIAGNOSIS — N946 Dysmenorrhea, unspecified: Secondary | ICD-10-CM

## 2013-08-26 DIAGNOSIS — N920 Excessive and frequent menstruation with regular cycle: Secondary | ICD-10-CM

## 2013-08-26 DIAGNOSIS — R32 Unspecified urinary incontinence: Secondary | ICD-10-CM

## 2013-08-26 NOTE — Progress Notes (Signed)
Subjective:     Patient ID: LEISA GAULT, female   DOB: Sep 27, 1975, 38 y.o.   MRN: [id number]  HPI Jeannene Patella is a 38 year old white female in for [place] for menorrhagia and dysmenorrhea.Has mixed UI, too.  Review of Systems See HPI Reviewed past medical,surgical, social and family history. Reviewed medications and allergies.     Objective:   Physical Exam BP 102/68  Ht 5' 3.5" (1.613 m)  Wt 132 lb (59.875 kg)  BMI 23.01 kg/m2  LMP 08/08/2013   Reviewed US Uterus 9.6 x 6.9 x 7.1 cm, retroverted, "streaky" shadows noted within myometrium(?adenomyosis) 1 fundal fibroid noted-3.6cm  Endometrium 3.2 mm, symmetrical,  Right ovary Surgically Absent although Rt adnexa appears WNL  Left ovary 4.1 x 2.2 x 2.0 cm,  No free fluid or adnexal masses noted within the pelvis  Technician Comments:  Uterus retroverted with "streaky" shadows noted within myometrium(?adenomyosis) and 1 fundal fibroid noted-3.6cm, Endometrium-3.87mm, bilateral adnexa/ovaries appear WNL no free fluid or adnexal masses noted within the pelvis  Would like ablation but would also like something done about UI   Assessment:    Menorrhagia Dysmenorrhea Mixed UI    Plan:     Follow up next week with Dr Elonda Husky to discuss UI and schedule ablation

## 2013-08-26 NOTE — Patient Instructions (Signed)
Follow up next week with Dr Elonda Husky

## 2013-09-01 ENCOUNTER — Encounter: Payer: Self-pay | Admitting: Obstetrics & Gynecology

## 2013-09-01 ENCOUNTER — Ambulatory Visit (INDEPENDENT_AMBULATORY_CARE_PROVIDER_SITE_OTHER): Payer: 59 | Admitting: Obstetrics & Gynecology

## 2013-09-01 VITALS — BP 90/60 | Ht 63.2 in | Wt 134.0 lb

## 2013-09-01 DIAGNOSIS — N920 Excessive and frequent menstruation with regular cycle: Secondary | ICD-10-CM | POA: Insufficient documentation

## 2013-09-01 DIAGNOSIS — IMO0002 Reserved for concepts with insufficient information to code with codable children: Secondary | ICD-10-CM | POA: Insufficient documentation

## 2013-09-01 DIAGNOSIS — N946 Dysmenorrhea, unspecified: Secondary | ICD-10-CM

## 2013-09-01 DIAGNOSIS — N3946 Mixed incontinence: Secondary | ICD-10-CM | POA: Insufficient documentation

## 2013-09-01 MED ORDER — MEGESTROL ACETATE 40 MG PO TABS: 40.0000 mg | ORAL_TABLET | Freq: Every day | ORAL | Status: DC

## 2013-09-01 MED ORDER — MIRABEGRON ER 50 MG PO TB24: 50.0000 mg | ORAL_TABLET | Freq: Every day | ORAL | Status: DC

## 2013-10-14 ENCOUNTER — Ambulatory Visit: Payer: 59 | Admitting: Obstetrics & Gynecology

## 2013-12-14 NOTE — Progress Notes (Signed)
Patient ID: Anna Gonzalez, female   DOB: [birth year], 38 y.o.   MRN: [id number] .[name] is a 38 y.o. LMP 08/08/2013 for a pelvic sonogram for dysmenorrhea and menorrhagia. Pt is s/p C/S, BTL and Rt oophorectomy.  Uterus 9.6 x 6.9 x 7.1 cm, retroverted, "streaky" shadows noted within myometrium(?adenomyosis) 1 fundal fibroid noted-3.6cm  Endometrium 3.2 mm, symmetrical,  Right ovary Surgically Absent although Rt adnexa appears WNL  Left ovary 4.1 x 2.2 x 2.0 cm,  No free fluid or adnexal masses noted within the pelvis  Technician Comments:  Uterus retroverted with "streaky" shadows noted within myometrium(?adenomyosis) and 1 fundal fibroid noted-3.6cm, Endometrium-3.78mm, bilateral adnexa/ovaries appear WNL no free fluid or adnexal masses noted within the pelvis  Alicia Amel  08/26/2013  10:57 AM  Small myoma, fundal  Thin endometrium  Normal left ovary  Florian Buff  08/28/2013  2:52 PM   Pt history reviewed per Jennifer's note Also with mixed, 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

## 2014-01-23 ENCOUNTER — Encounter: Payer: Self-pay | Admitting: Obstetrics & Gynecology

## 2014-02-01 ENCOUNTER — Emergency Department (HOSPITAL_COMMUNITY)
Admission: EM | Admit: 2014-02-01 | Discharge: 2014-02-01 | Disposition: A | Discharge status: Home / Self Care | Payer: 59 | Attending: Emergency Medicine | Admitting: Emergency Medicine

## 2014-02-01 ENCOUNTER — Encounter (HOSPITAL_COMMUNITY): Payer: Self-pay | Admitting: Emergency Medicine

## 2014-02-01 ENCOUNTER — Emergency Department (HOSPITAL_COMMUNITY): Payer: 59

## 2014-02-01 DIAGNOSIS — M25511 Pain in right shoulder: Secondary | ICD-10-CM | POA: Diagnosis not present

## 2014-02-01 DIAGNOSIS — M436 Torticollis: Secondary | ICD-10-CM | POA: Diagnosis not present

## 2014-02-01 DIAGNOSIS — M542 Cervicalgia: Secondary | ICD-10-CM | POA: Diagnosis present

## 2014-02-01 DIAGNOSIS — Z8742 Personal history of other diseases of the female genital tract: Secondary | ICD-10-CM | POA: Insufficient documentation

## 2014-02-01 DIAGNOSIS — Z87891 Personal history of nicotine dependence: Secondary | ICD-10-CM | POA: Insufficient documentation

## 2014-02-01 DIAGNOSIS — Z79899 Other long term (current) drug therapy: Secondary | ICD-10-CM | POA: Insufficient documentation

## 2014-02-01 MED ORDER — KETOROLAC TROMETHAMINE 10 MG PO TABS
10.0000 mg | ORAL_TABLET | Freq: Once | ORAL | Status: AC
  Administered 2014-02-01: 10 mg via ORAL
  Filled 2014-02-01: qty 1

## 2014-02-01 MED ORDER — OXYCODONE-ACETAMINOPHEN 5-325 MG PO TABS: 1.0000 | ORAL_TABLET | Freq: Four times a day (QID) | ORAL | Status: DC | PRN

## 2014-02-01 MED ORDER — METHOCARBAMOL 500 MG PO TABS: 500.0000 mg | ORAL_TABLET | Freq: Three times a day (TID) | ORAL | Status: DC

## 2014-02-01 MED ORDER — CELECOXIB 200 MG PO CAPS: 200.0000 mg | ORAL_CAPSULE | Freq: Two times a day (BID) | ORAL | Status: DC

## 2014-02-01 MED ORDER — OXYCODONE-ACETAMINOPHEN 5-325 MG PO TABS
2.0000 | ORAL_TABLET | Freq: Once | ORAL | Status: AC
  Administered 2014-02-01: 2 via ORAL
  Filled 2014-02-01: qty 2

## 2014-02-01 MED ORDER — ONDANSETRON HCL 4 MG PO TABS
4.0000 mg | ORAL_TABLET | Freq: Once | ORAL | Status: AC
  Administered 2014-02-01: 4 mg via ORAL
  Filled 2014-02-01: qty 1

## 2014-02-01 MED ORDER — DIAZEPAM 5 MG PO TABS
5.0000 mg | ORAL_TABLET | Freq: Once | ORAL | Status: AC
Start: 2014-02-01 — End: 2014-02-01
  Administered 2014-02-01: 5 mg via ORAL
  Filled 2014-02-01: qty 1

## 2014-02-01 NOTE — ED Provider Notes (Signed)
CSN: [id number]     Arrival date & time 02/01/14  1605 History   First MD Initiated Contact with Patient 02/01/14 1856     Chief Complaint  Patient presents with  . Neck Pain     (Consider location/radiation/quality/duration/timing/severity/associated sxs/prior Treatment) HPI Comments: Patient is a 38 year old female presents to the emergency department with a complaint of right neck pain. The patient states that she has had some problems with her neck for the past several months, but this morning she could hardly move her head off the pillow because of pain. She has not had any recent injury or accidents. His been no falls. There's been no recent operations or procedures. The patient has not had any high fever or chills reported. She's tried applying warm compress but this is not been successful. She has not had any change in ability to grip,, or any sensory changes. She presents now for assistance with this problem.  The history is provided by the patient.    Past Medical History  Diagnosis Date  . Breast nodule 08/12/2013  . Menorrhagia 08/12/2013  . Dysmenorrhea 08/12/2013  . UI (urinary incontinence) 08/12/2013   Past Surgical History  Procedure Laterality Date  . Oophorectomy    . Appendectomy    . Breast cyst excision    . Cesarean section    . Tubal ligation     Family History  Problem Relation Age of Onset  . Cancer Mother   . Dementia Father   . Bipolar disorder Sister   . Cancer Maternal Grandmother     breast  . Alzheimer's disease Paternal Grandmother   . Heart attack Paternal Grandfather    History  Substance Use Topics  . Smoking status: Former Smoker -- 1.00 packs/day  . Smokeless tobacco: Never Used  . Alcohol Use: Yes     Comment: occasionally   OB History    Gravida Para Term Preterm AB TAB SAB Ectopic Multiple Living   4 3   1  1         Review of Systems  Constitutional: Negative.   HENT: Negative.   Eyes: Negative.   Respiratory: Negative.    Cardiovascular: Negative.   Gastrointestinal: Negative.   Endocrine: Negative.   Musculoskeletal: Positive for arthralgias, neck pain and neck stiffness.  Skin: Negative.   Allergic/Immunologic: Negative.   Neurological: Negative.   Hematological: Negative.   Psychiatric/Behavioral: Negative.       Allergies  Review of patient's allergies indicates no known allergies.  Home Medications   Prior to Admission medications   Medication Sig Start Date End Date Taking? Authorizing Provider  megestrol (MEGACE) 40 MG tablet Take 1 tablet (40 mg total) by mouth daily. 09/01/13   Florian Buff, MD  mirabegron ER (MYRBETRIQ) 50 MG TB24 tablet Take 1 tablet (50 mg total) by mouth daily. 09/01/13   Florian Buff, MD   BP 109/76 mmHg  Pulse 68  Temp(Src) 98.5 F (36.9 C) (Oral)  Resp 18  Ht 5\' 3"  (1.6 m)  Wt 134 lb (60.782 kg)  BMI 23.74 kg/m2  SpO2 100%  LMP 01/11/2014 Physical Exam  Constitutional: She is oriented to person, place, and time. She appears well-developed and well-nourished.  Non-toxic appearance.  HENT:  Head: Normocephalic.  Right Ear: Tympanic membrane and external ear normal.  Left Ear: Tympanic membrane and external ear normal.  Eyes: EOM and lids are normal. Pupils are equal, round, and reactive to light.  Neck: Phonation normal. Normal carotid pulses and  no JVD present. Muscular tenderness present. No tracheal tenderness present. Carotid bruit is not present. No rigidity. Decreased range of motion present. No tracheal deviation and no erythema present. No thyroid mass present.  Cardiovascular: Normal rate, regular rhythm, normal heart sounds, intact distal pulses and normal pulses.   Pulmonary/Chest: Breath sounds normal. No stridor. No respiratory distress.  Abdominal: Soft. Bowel sounds are normal. There is no tenderness. There is no guarding.  Lymphadenopathy:       Head (right side): No submandibular adenopathy present.       Head (left side): No submandibular  adenopathy present.    She has no cervical adenopathy.  Neurological: She is alert and oriented to person, place, and time. She has normal strength. No cranial nerve deficit or sensory deficit.  Skin: Skin is warm and dry.  Psychiatric: She has a normal mood and affect. Her speech is normal.  Nursing note and vitals reviewed.   ED Course  Procedures (including critical care time) Labs Review Labs Reviewed - No data to display  Imaging Review No results found.   EKG Interpretation None      MDM  Vital signs are well within normal limits. There is no evidence for any meningitis or meningeal signs. There no palpable abscess areas. This no cervical lymphadenopathy appreciated. Is no evidence of injury or trauma. There is tightness and tenseness from the mid neck to the upper trapezius on the right.  CT scan of the cervical spine reveals the vertebral on bodies to be in good alignment. The height of the disc spaces are within normal limits. The prevertebral soft tissue areas are well within normal limits. There is noted joint spurring at the C5-C6 on level.  Suspect that the pain is related to degenerative joint disease, and/or degenerative disc disease. There no gross neurovascular deficits at this time.  Patient was treated in the emergency department with diazepam, Toradol, and Percocet. Patient got some relief from this combination of medications. Prescription for Robaxin, Percocet, and Celebrex given to the patient. The patient is to follow with her primary physician for orthopedic referral, and possible MRI evaluation. Patient is to return to the emergency department immediately if any changes, problems, or concerns.    Final diagnoses:  Shoulder pain, acute, right  Torticollis    **I have reviewed nursing notes, vital signs, and all appropriate lab and imaging results for this patient.Lenox Ahr, PA-C 02/06/14 [place], MD 02/07/14 [phone]

## 2014-02-01 NOTE — Discharge Instructions (Signed)
Torticollis, Acute Your CT scan is negative for acute changes. Your examination suggest spasm involving the neck shoulder area. Please use a heating pad to this area. Please use Celebrex and Robaxin daily. Please use Percocet for more severe pain. Robaxin and Percocet may cause drowsiness, and/or constipation. Please use these medications with caution. Please see the orthopedic specialist listed above if not improving.                                                                                                                                                                          You have suddenly (acutely) developed a twisted neck (torticollis). This is usually a self-limited condition. CAUSES  Acute torticollis may be caused by malposition, trauma or infection. Most commonly, acute torticollis is caused by sleeping in an awkward position. Torticollis may also be caused by the flexion, extension or twisting of the neck muscles beyond their normal position. Sometimes, the exact cause may not be known. SYMPTOMS  Usually, there is pain and limited movement of the neck. Your neck may twist to one side. DIAGNOSIS  The diagnosis is often made by physical examination. X-rays, CT scans or MRIs may be done if there is a history of trauma or concern of infection. TREATMENT  For a common, stiff neck that develops during sleep, treatment is focused on relaxing the contracted neck muscle. Medications (including shots) may be used to treat the problem. Most cases resolve in several days. Torticollis usually responds to conservative physical therapy. If left untreated, the shortened and spastic neck muscle can cause deformities in the face and neck. Rarely, surgery is required. HOME CARE INSTRUCTIONS   Use over-the-counter and prescription medications as directed by your caregiver.  Do stretching exercises and massage the neck as directed by your caregiver.  Follow up with physical therapy if needed and as  directed by your caregiver. SEEK IMMEDIATE MEDICAL CARE IF:   You develop difficulty breathing or noisy breathing (stridor).  You drool, develop trouble swallowing or have pain with swallowing.  You develop numbness or weakness in the hands or feet.  You have changes in speech or vision.  You have problems with urination or bowel movements.  You have difficulty walking.  You have a fever.  You have increased pain. MAKE SURE YOU:   Understand these instructions.  Will watch your condition.  Will get help right away if you are not doing well or get worse. Document Released: 03/07/2000 Document Revised: 06/02/2011 Document Reviewed: 04/18/2009 [facility] Patient Information 2015 [place], [place]. This information is not intended to replace advice given to you by your health care provider. Make sure you discuss any questions you have with your health care provider.

## 2014-02-01 NOTE — ED Notes (Signed)
Pt reports neck pain which has been present for several months. Worse since this am.

## 2014-02-01 NOTE — ED Notes (Signed)
Hobson PA at bedside,  

## 2014-02-01 NOTE — ED Notes (Signed)
PT c/o right sided neck pain worsening this am and pain radiates down right arm.

## 2014-08-18 ENCOUNTER — Encounter (HOSPITAL_COMMUNITY): Payer: Self-pay | Admitting: *Deleted

## 2014-08-18 ENCOUNTER — Emergency Department (HOSPITAL_COMMUNITY)
Admission: EM | Admit: 2014-08-18 | Discharge: 2014-08-18 | Disposition: A | Discharge status: Home / Self Care | Payer: 59 | Attending: Emergency Medicine | Admitting: Emergency Medicine

## 2014-08-18 DIAGNOSIS — Z8742 Personal history of other diseases of the female genital tract: Secondary | ICD-10-CM | POA: Diagnosis not present

## 2014-08-18 DIAGNOSIS — K0381 Cracked tooth: Secondary | ICD-10-CM | POA: Insufficient documentation

## 2014-08-18 DIAGNOSIS — K0889 Other specified disorders of teeth and supporting structures: Secondary | ICD-10-CM

## 2014-08-18 DIAGNOSIS — Z87891 Personal history of nicotine dependence: Secondary | ICD-10-CM | POA: Diagnosis not present

## 2014-08-18 DIAGNOSIS — Z791 Long term (current) use of non-steroidal anti-inflammatories (NSAID): Secondary | ICD-10-CM | POA: Insufficient documentation

## 2014-08-18 DIAGNOSIS — Z79899 Other long term (current) drug therapy: Secondary | ICD-10-CM | POA: Diagnosis not present

## 2014-08-18 DIAGNOSIS — K088 Other specified disorders of teeth and supporting structures: Secondary | ICD-10-CM | POA: Insufficient documentation

## 2014-08-18 MED ORDER — PENICILLIN V POTASSIUM 500 MG PO TABS: 500.0000 mg | ORAL_TABLET | Freq: Three times a day (TID) | ORAL | Status: DC

## 2014-08-18 MED ORDER — BUPIVACAINE HCL (PF) 0.5 % IJ SOLN
10.0000 mL | Freq: Once | INTRAMUSCULAR | Status: AC
  Administered 2014-08-18: 10 mL
  Filled 2014-08-18: qty 30

## 2014-08-18 NOTE — Discharge Instructions (Signed)
Dental Pain °A tooth ache may be caused by cavities (tooth decay). Cavities expose the nerve of the tooth to air and hot or cold temperatures. It may come from an infection or abscess (also called a boil or furuncle) around your tooth. It is also often caused by dental caries (tooth decay). This causes the pain you are having. °DIAGNOSIS  °Your caregiver can diagnose this problem by exam. °TREATMENT  °· If caused by an infection, it may be treated with medications which kill germs (antibiotics) and pain medications as prescribed by your caregiver. Take medications as directed. °· Only take over-the-counter or prescription medicines for pain, discomfort, or fever as directed by your caregiver. °· Whether the tooth ache today is caused by infection or dental disease, you should see your dentist as soon as possible for further care. °SEEK MEDICAL CARE IF: °The exam and treatment you received today has been provided on an emergency basis only. This is not a substitute for complete medical or dental care. If your problem worsens or new problems (symptoms) appear, and you are unable to meet with your dentist, call or return to this location. °SEEK IMMEDIATE MEDICAL CARE IF:  °· You have a fever. °· You develop redness and swelling of your face, jaw, or neck. °· You are unable to open your mouth. °· You have severe pain uncontrolled by pain medicine. °MAKE SURE YOU:  °· Understand these instructions. °· Will watch your condition. °· Will get help right away if you are not doing well or get worse. °Document Released: 03/10/2005 Document Revised: 06/02/2011 Document Reviewed: 10/27/2007 °ExitCare® Patient Information ©2015 [employer]. This information is not intended to replace advice given to you by your health care provider. Make sure you discuss any questions you have with your health care provider. ° °Emergency Department Resource Guide °1) Find a Doctor and Pay Out of Pocket °Although you won't have to find out who  is covered by your insurance plan, it is a good idea to ask around and get recommendations. You will then need to call the office and see if the doctor you have chosen will accept you as a new patient and what types of options they offer for patients who are self-pay. Some doctors offer discounts or will set up payment plans for their patients who do not have insurance, but you will need to ask so you aren't surprised when you get to your appointment. ° °2) Contact Your Local Health Department °Not all health departments have doctors that can see patients for sick visits, but many do, so it is worth a call to see if yours does. If you don't know where your local health department is, you can check in your phone book. The CDC also has a tool to help you locate your state's health department, and many state websites also have listings of all of their local health departments. ° °3) Find a Walk-in Clinic °If your illness is not likely to be very severe or complicated, you may want to try a walk in clinic. These are popping up all over the country in pharmacies, drugstores, and shopping centers. They're usually staffed by nurse practitioners or physician assistants that have been trained to treat common illnesses and complaints. They're usually fairly quick and inexpensive. However, if you have serious medical issues or chronic medical problems, these are probably not your best option. ° °No Primary Care Doctor: °- Call [facility] at  832-8000 - they can help you locate a primary   care doctor that  accepts your insurance, provides certain services, etc. °- Physician Referral Service- 1-800-533-3463 ° °Chronic Pain Problems: °Organization         Address  Phone   Notes  °[facility]  (336) 297-2271 Patients need to be referred by their primary care doctor.  ° °Medication Assistance: °Organization         Address  Phone   Notes  °[place] [facility] [address]  [address] °[place], [place] [address] (336) 641-8030 --Must be a resident of [place] °-- Must have NO insurance coverage whatsoever (no Medicaid/ Medicare, etc.) °-- The pt. MUST have a primary care doctor that directs their care regularly and follows them in the community °  °[facility]  (866) 331-1348   °[employer]  (888) 892-1162   ° °Agencies that provide inexpensive medical care: °Organization         Address  Phone   Notes  °[facility]  (336) 832-8035   °[facility]    (336) 832-7272   °[facility] [address] °[place], [place] [address] (336) 832-4777   °[facility] [address], °[place] (336) 271-4999   °[facility]    (336) 373-0678   °[facility]    (336) 272-1050   °[facility] ° [address], [place] Phone:  (336) 832-4444, Fax:  (336) 832-4440 Hours of Operation:  9 am - 6 pm, M-F.  Also accepts Medicaid/Medicare and self-pay.  °[facility] ° [address], [place] Phone: (336) 832-3150, Fax: (336) 832-3151. Hours of Operation:  8:30 am - 5:30 pm, M-F.  Also accepts Medicaid and self-pay.  °[facility] [place] [address], [place] Phone: (336) 878-6027   °[facility] [address], [place], [place] (336)723-1848, Ext. 123 Mondays & Thursdays: 7-9 AM.  First 15 patients are seen on a first come, first serve basis. °  ° °Medicaid-accepting [place] Providers: ° °Organization         Address  Phone   Notes  °[facility] [address], [place] (336) 641-2100 Also accepts self-pay patients.  °[facility] [address], [place] ° (336) 856-9996   °[facility] [address], [place] (336) 288-8857   °[facility] [address] [place] [address], [place] (336) 299-7000   °Veita Bland [address], [place]  ° (336) 373-1557 Only accepts [place] Access Medicaid patients after they have their name applied to their card.  ° °Self-Pay (no insurance) in [place]: ° °Organization         Address  Phone   Notes  °Sickle Cell Patients, [facility] [address], [place] (336) 832-1970   °[facility] [address], [place] (336) 832-4400   °[facility] ° [address], [place] (336) 992-4800   °[facility]/Dr. Osei-Bonsu ° [address] [place] [address], [place] or [address], [place] (336) 841-8500 Phone number for both [place] and [place] locations is the same.  °[facility] [address], [place] (336) 299-0000   °[facility] [address] [place] [address], [place] or [address] (336) 852-7530 °(336) 878-2260   °  [facility] [address], [place] (336) 350-1642, phone; (336) 294-5005, fax Sees patients 1st and 3rd Saturday of every month.  Must not qualify for public or private insurance (i.e. Medicaid, Medicare, [place] Health Choice, Veterans' Benefits) • Household income should be no more than 200% of the poverty level •The clinic cannot treat you if you are pregnant or think you are pregnant • Sexually transmitted diseases are not treated at the clinic.  ° ° °Dental Care: °Organization         Address  Phone  Notes  °[place] [facility] [address], [place] (336) 641-6152 Accepts children up to age 21 who are enrolled in Medicaid or [place] Health Choice; pregnant women with a Medicaid card; and children who have applied for Medicaid or [place] Health Choice, but were declined, whose parents can pay a reduced fee at time of service.  °[facility] [place]  [address], [place] (336) 641-7733 Accepts children up to age 21 who are  enrolled in Medicaid or [place] Health Choice; pregnant women with a Medicaid card; and children who have applied for Medicaid or [place] Health Choice, but were declined, whose parents can pay a reduced fee at time of service.  °[facility] ° [address], [place] (336) 641-4533 Patients are seen by appointment only. Walk-ins are not accepted. [facility] will see patients 18 years of age and older. °Monday - Tuesday (8am-5pm) °Most Wednesdays (8:30-5pm) °$30 per visit, cash only  °[facility] ° [address], [place] (336) 641-4533 Patients are seen by appointment only. Walk-ins are not accepted. [facility] will see patients 18 years of age and older. °One Wednesday Evening (Monthly: Volunteer Based).  $30 per visit, cash only  °[facility]  (919) 537-3737 for adults; Children under age 4, call Graduate Pediatric Dentistry at (919) [phone]. Children aged 4-14, please call (919) [phone] to request a pediatric application. ° Dental services are provided in all areas of dental care including fillings, crowns and bridges, complete and partial dentures, implants, gum treatment, root canals, and extractions. Preventive care is also provided. Treatment is provided to both adults and children. °Patients are selected via a lottery and there is often a waiting list. °  °[facility] [address], ° ° (336) 763-8833 [email or web address] °  °[facility] [address], [place], [place] (336)723-1848, Ext. 123 Second and Fourth Thursday of each month, opens at 6:30 AM; Clinic ends at 9 AM.  Patients are seen on a first-come first-served basis, and a limited number are seen during each clinic.  ° °[facility] ° [address], [place], [place] (336) 723-7904   Eligibility Requirements °You must have lived in [place], [place], or [place] counties for at least the last three months. °  You  cannot be eligible for state or federal sponsored healthcare insurance, including Veterans Administration, Medicaid, or Medicare. °  You generally cannot be eligible for healthcare insurance through your employer.  °  How to apply: °Eligibility screenings are held every Tuesday and Wednesday afternoon from 1:00 pm until 4:00 pm. You do not need an appointment for the interview!  °[facility] [address], [place],  336-631-2330   °[facility]  336-342-8273   °[facility]  336-703-3100   °[facility]  336-570-6415   ° °Behavioral Health Resources in the Community: °Intensive Outpatient Programs °Organization         Address  Phone  Notes  °[facility] [address], [place], [place] 336-878-6098   °[facility] [address], [place], [place] 336-832-9800   °[facility] [address], [place], [place] ° 336-882-2125   °[facility] [address],  °[place], [place] 1-800-853-5163 or 336-641-4981   °Substance Abuse Resources °Organization         Address  Phone  Notes  °[facility]  336-882-2125   °[facility]  336-784-9470   °[facility]  336-285-9073   °[facility]  336-845-3988   °Residential & Outpatient Substance Abuse Program  1-800-659-3381   °Psychological Services °Organization         Address  Phone  Notes  °[facility]  336- 832-9600   °[facility]  336- 378-7881   °[facility] [address], [place] 1-800-853-5163 or 336-641-4981   ° °Mobile Crisis Teams °Organization         Address  Phone  Notes  °[facility], Mobile Crisis Care Unit  1-877-626-1772   °Assertive °[facility] ° [address] [place], [place] 336-834-9664   °Sharon DeEsch [address] °[place] 336-554-5454   ° °Self-Help/Support  Groups °Organization         Address  Phone             Notes  °[facility] - variety of support groups  336- 373-1402 Call for more information  °Narcotics Anonymous (NA), [facility] [address], °[place]  2 meetings at this location  ° °Residential Treatment Programs °Organization         Address  Phone  Notes  °[facility] [address],    °[place]  1-866-801-8205   °[facility] ° [address], [place], [place] 704-293-8524   °[facility] [address], [place] 336-845-3988 Admissions: 8am-3pm M-F  °[facility] [address]    °[place], [place] 336-841-1104   °[facility] [address], [place], [place] 336-379-7146   °[facility] [address]  °[place], [place] 336-285-9073   °[facility] Intensive Outpatient [address], [place], [place] 336-852-3033   °[facility] [address]  °[place], [place] 1-877-615-2722 or 336-784-9470   °[facility] [address] [place], [place] 336-227-7417 Accepts Medicaid  °[facility] [address]  °[place] 1-800-659-3381 Substance Abuse/Addiction Treatment  ° °[place] Behavioral Health Resources °Organization         Address  Phone  Notes  °[facility]  (888) 581-9988   °Julie Brannon, PhD [address] [place], [place]   (336) 349-5553 or (336) 951-0000   °[facility]   [address] °[place] (336) 349-4454   °[facility] [address], [place], [place] (336) 342-8316 Insurance/Medicaid/sponsorship through [facility]  °[facility] [address]                                    [place],  (336) 342-8316 Therapy/tele-psych/case  °[facility]   [address]  ° [place], [place] (336) 349-2233    °Dr. Arfeen  (336) 349-4544   °[facility] [place]  [facility]  [facility] [place] [facility] 1) [address], [place] °2) [address], [place] °3)  [address], [place] (336) 349-3220 °(336) 342-7768 ° °(336) 342-8140   °[place] [facility] (336) 342-1394 or (336) 342-3537 (After Hours)    ° ° ° °

## 2014-08-18 NOTE — ED Notes (Signed)
Pt reports upper left dental pain that started yesterday, dentist unable to see pt due to holiday weekend hours.

## 2014-08-18 NOTE — ED Provider Notes (Signed)
CSN: [id number]     Arrival date & time 08/18/14  0732 History   First MD Initiated Contact with Patient 08/18/14 [phone]     Chief Complaint  Patient presents with  . Dental Pain     (Consider location/radiation/quality/duration/timing/severity/associated sxs/prior Treatment) HPI   39 year old female with dental pain. Left upper tooth. Onset yesterday. Constant progressive since then. Called dentist but unable to see over the weekend. Has been taking over-the-counter pain medicine with minimal relief. No fevers or chills. No facial swelling. No difficulty breathing or swallowing.  Past Medical History  Diagnosis Date  . Breast nodule 08/12/2013  . Menorrhagia 08/12/2013  . Dysmenorrhea 08/12/2013  . UI (urinary incontinence) 08/12/2013   Past Surgical History  Procedure Laterality Date  . Oophorectomy    . Appendectomy    . Breast cyst excision    . Cesarean section    . Tubal ligation     Family History  Problem Relation Age of Onset  . Cancer Mother   . Dementia Father   . Bipolar disorder Sister   . Cancer Maternal Grandmother     breast  . Alzheimer's disease Paternal Grandmother   . Heart attack Paternal Grandfather    History  Substance Use Topics  . Smoking status: Former Smoker -- 1.00 packs/day  . Smokeless tobacco: Never Used  . Alcohol Use: Yes     Comment: occasionally   OB History    Gravida Para Term Preterm AB TAB SAB Ectopic Multiple Living   4 3   1  1         Review of Systems  All systems reviewed and negative, other than as noted in HPI.   Allergies  Review of patient's allergies indicates no known allergies.  Home Medications   Prior to Admission medications   Medication Sig Start Date End Date Taking? Authorizing Provider  celecoxib (CELEBREX) 200 MG capsule Take 1 capsule (200 mg total) by mouth 2 (two) times daily. 02/01/14   Lily Kocher, PA-C  megestrol (MEGACE) 40 MG tablet Take 1 tablet (40 mg total) by mouth daily. Patient not  taking: Reported on 02/01/2014 09/01/13   Florian Buff, MD  methocarbamol (ROBAXIN) 500 MG tablet Take 1 tablet (500 mg total) by mouth 3 (three) times daily. 02/01/14   Lily Kocher, PA-C  mirabegron ER (MYRBETRIQ) 50 MG TB24 tablet Take 1 tablet (50 mg total) by mouth daily. Patient not taking: Reported on 02/01/2014 09/01/13   Florian Buff, MD  oxyCODONE-acetaminophen (PERCOCET/ROXICET) 5-325 MG per tablet Take 1 tablet by mouth every 6 (six) hours as needed. 02/01/14   Lily Kocher, PA-C  penicillin v potassium (VEETID) 500 MG tablet Take 1 tablet (500 mg total) by mouth 3 (three) times daily. 08/18/14   Virgel Manifold, MD   BP 112/63 mmHg  Pulse 70  Temp(Src) 97.9 F (36.6 C) (Oral)  Resp 18  Ht 5\' 3"  (1.6 m)  Wt 125 lb (56.7 kg)  BMI 22.15 kg/m2  SpO2 100%  LMP 08/11/2014 Physical Exam  Constitutional: She appears well-developed and well-nourished. No distress.  HENT:  Head: Normocephalic and atraumatic.  Cracked left upper premolar. No drainable collection. Tender to percussion.   Eyes: Conjunctivae are normal. Right eye exhibits no discharge. Left eye exhibits no discharge.  Neck: Neck supple.  Cardiovascular: Normal rate, regular rhythm and normal heart sounds.  Exam reveals no gallop and no friction rub.   No murmur heard. Pulmonary/Chest: Effort normal and breath sounds normal. No respiratory distress.  Abdominal: Soft. She exhibits no distension. There is no tenderness.  Musculoskeletal: She exhibits no edema or tenderness.  Neurological: She is alert.  Skin: Skin is warm and dry.  Psychiatric: She has a normal mood and affect. Her behavior is normal. Thought content normal.  Nursing note and vitals reviewed.   ED Course  Dental Date/Time: 08/18/2014 8:52 AM Performed by: Virgel Manifold Authorized by: Virgel Manifold Consent: Verbal consent obtained. Written consent not obtained. Risks and benefits: risks, benefits and alternatives were discussed Local anesthesia  used: yes Local anesthetic: bupivacaine 0.5% without epinephrine Anesthetic total: 1.5 ml Patient tolerance: Patient tolerated the procedure well with no immediate complications Comments: Apical block of L upper premolar   (including critical care time)   Labs Review Labs Reviewed - No data to display  Imaging Review No results found.   EKG Interpretation None      MDM   Final diagnoses:  Pain, dental    39yF with dental pain. Tender to percussion. Minimal L facial swelling. No evidence of serious deep space head/neck infection. Apical block done. Abx. Continued NSAIDs. Dental FU.     Virgel Manifold, MD 08/24/14 [phone]

## 2015-02-01 ENCOUNTER — Telehealth: Payer: Self-pay | Admitting: Nurse Practitioner

## 2015-02-01 NOTE — Telephone Encounter (Signed)
Can this pt have an appt tomorrow

## 2015-02-01 NOTE — Telephone Encounter (Signed)
Anna Gonzalez,  This is a new patient for [place], her family comes here and this would be her  First visit. However she is needing to be seen for anxiety issues due to  Her family dynamics an needs to be seen sooner than the December time  Frame.    When can you fit her into your schedule

## 2015-02-01 NOTE — Telephone Encounter (Signed)
Done

## 2015-02-01 NOTE — Telephone Encounter (Signed)
Appointment tomorrow at 10:30 is fine

## 2015-02-02 ENCOUNTER — Ambulatory Visit (INDEPENDENT_AMBULATORY_CARE_PROVIDER_SITE_OTHER): Payer: 59 | Admitting: Nurse Practitioner

## 2015-02-02 ENCOUNTER — Encounter: Payer: Self-pay | Admitting: Nurse Practitioner

## 2015-02-02 VITALS — BP 114/72 | Ht 64.0 in | Wt 127.0 lb

## 2015-02-02 DIAGNOSIS — F419 Anxiety disorder, unspecified: Secondary | ICD-10-CM

## 2015-02-02 DIAGNOSIS — F430 Acute stress reaction: Principal | ICD-10-CM

## 2015-02-02 DIAGNOSIS — F411 Generalized anxiety disorder: Secondary | ICD-10-CM

## 2015-02-02 MED ORDER — SERTRALINE HCL 50 MG PO TABS: 50.0000 mg | ORAL_TABLET | Freq: Every day | ORAL | Status: DC

## 2015-02-02 MED ORDER — CLONAZEPAM 0.5 MG PO TABS: ORAL_TABLET | ORAL | Status: DC

## 2015-02-05 ENCOUNTER — Encounter: Payer: Self-pay | Admitting: Nurse Practitioner

## 2015-02-05 DIAGNOSIS — F411 Generalized anxiety disorder: Secondary | ICD-10-CM | POA: Insufficient documentation

## 2015-02-05 DIAGNOSIS — F430 Acute stress reaction: Principal | ICD-10-CM

## 2015-02-05 NOTE — Progress Notes (Signed)
Subjective:  Presents as a new patient to discuss her anxiety. Has been going on for months. Most of it relates to personal situations at home with her children. Complaints of fatigue, sleep disturbance and emotional lability. Also seen gynecology for irregular cycles, has bleeding off-and-on every 2 weeks. Denies suicidal or homicidal thoughts or ideation. Has had some OCD, for example has to check her house several times to make sure everything is turned off.  Objective:   BP 114/72 mmHg  Ht 5\' 4"  (1.626 m)  Wt 127 lb (57.607 kg)  BMI 21.79 kg/m2 NAD. Alert, oriented. Thoughts logical coherent and relevant. Dressed appropriately. Crying during most of the office visit. Lungs clear. Heart regular rate rhythm.  Assessment:  Problem List Items Addressed This Visit      Other   Anxiety as acute reaction to exceptional stress - Primary   Relevant Medications   sertraline (ZOLOFT) 50 MG tablet     Plan:  Meds ordered this encounter  Medications  . sertraline (ZOLOFT) 50 MG tablet    Sig: Take 1 tablet (50 mg total) by mouth daily.    Dispense:  30 tablet    Refill:  0    Order Specific Question:  Supervising Provider    Answer:  Mikey Kirschner [[id number]]  . clonazePAM (KLONOPIN) 0.5 MG tablet    Sig: 1/2-1 po BID prn anxiety    Dispense:  30 tablet    Refill:  0    Order Specific Question:  Supervising Provider    Answer:  Maggie Font   Strongly encourage patient to consider counseling. Start Zoloft 50 mg half tab for several days then if tolerated increase to 1 tab. Reviewed potential adverse effects. DC med and call if any problems. Use Klonopin sparingly for severe anxiety symptoms. Recommend recheck with gynecology regarding her cycles since hormones are probably a factor in her emotions. Return in about 1 month (around 03/04/2015) for recheck.

## 2015-02-20 ENCOUNTER — Emergency Department (HOSPITAL_COMMUNITY)
Admission: EM | Admit: 2015-02-20 | Discharge: 2015-02-20 | Disposition: A | Discharge status: Home / Self Care | Payer: 59 | Attending: Emergency Medicine | Admitting: Emergency Medicine

## 2015-02-20 ENCOUNTER — Encounter (HOSPITAL_COMMUNITY): Payer: Self-pay | Admitting: *Deleted

## 2015-02-20 DIAGNOSIS — Z9851 Tubal ligation status: Secondary | ICD-10-CM | POA: Insufficient documentation

## 2015-02-20 DIAGNOSIS — N390 Urinary tract infection, site not specified: Secondary | ICD-10-CM

## 2015-02-20 DIAGNOSIS — R110 Nausea: Secondary | ICD-10-CM | POA: Insufficient documentation

## 2015-02-20 DIAGNOSIS — Z9889 Other specified postprocedural states: Secondary | ICD-10-CM | POA: Insufficient documentation

## 2015-02-20 DIAGNOSIS — Z87891 Personal history of nicotine dependence: Secondary | ICD-10-CM | POA: Insufficient documentation

## 2015-02-20 DIAGNOSIS — Z8742 Personal history of other diseases of the female genital tract: Secondary | ICD-10-CM | POA: Insufficient documentation

## 2015-02-20 DIAGNOSIS — R300 Dysuria: Secondary | ICD-10-CM | POA: Diagnosis present

## 2015-02-20 LAB — URINE MICROSCOPIC-ADD ON

## 2015-02-20 LAB — URINALYSIS, ROUTINE W REFLEX MICROSCOPIC
BILIRUBIN URINE: NEGATIVE
GLUCOSE, UA: NEGATIVE mg/dL
KETONES UR: NEGATIVE mg/dL
NITRITE: POSITIVE — AB
PH: 6 (ref 5.0–8.0)
PROTEIN: 100 mg/dL — AB
Specific Gravity, Urine: 1.015 (ref 1.005–1.030)

## 2015-02-20 MED ORDER — CEPHALEXIN 500 MG PO CAPS: 500.0000 mg | ORAL_CAPSULE | Freq: Four times a day (QID) | ORAL | Status: DC

## 2015-02-20 MED ORDER — CEPHALEXIN 500 MG PO CAPS
500.0000 mg | ORAL_CAPSULE | Freq: Once | ORAL | Status: AC
  Administered 2015-02-20: 500 mg via ORAL
  Filled 2015-02-20: qty 1

## 2015-02-20 MED ORDER — PHENAZOPYRIDINE HCL 100 MG PO TABS
200.0000 mg | ORAL_TABLET | Freq: Once | ORAL | Status: AC
  Administered 2015-02-20: 200 mg via ORAL
  Filled 2015-02-20: qty 2

## 2015-02-20 MED ORDER — PHENAZOPYRIDINE HCL 200 MG PO TABS: 200.0000 mg | ORAL_TABLET | Freq: Three times a day (TID) | ORAL | Status: DC

## 2015-02-20 NOTE — ED Notes (Signed)
   02/20/15 1959  Genitourinary  Genitourinary (WDL) X  Urinary Status Continent  Urine Color Yellow/straw  Urine Appearance Clear  pt reports pain w/ urination that started 3 days ago. Pt says she is having some pain up to the right flank also.

## 2015-02-20 NOTE — ED Notes (Signed)
Pt reporting burning and pain with urination since Sunday.  Reporting urgency and frequency of urination as well.

## 2015-02-20 NOTE — Discharge Instructions (Signed)
Urinalysis consistent with urinary tract infection. Take the Pyridium will make your urine orange in color. Also take the antibiotic Keflex as directed. Would expect to be improving in 2 days. Work note provided. Return for any new or worse symptoms or if not better in 2 days.

## 2015-02-20 NOTE — ED Provider Notes (Signed)
CSN: [id number]     Arrival date & time 02/20/15  1937 History  By signing my name below, I, Eustaquio Maize, attest that this documentation has been prepared under the direction and in the presence of Fredia Sorrow, MD. Electronically Signed: Eustaquio Maize, ED Scribe. 02/20/2015. 8:22 PM.  Chief Complaint  Patient presents with  . Urinary Tract Infection   Patient is a 39 y.o. female presenting with urinary tract infection. The history is provided by the patient. No language interpreter was used.  Urinary Tract Infection Pain quality:  Unable to specify Pain severity:  Mild Onset quality:  Gradual Duration:  3 days Timing:  Constant Progression:  Unchanged Chronicity:  New Recent urinary tract infections: no   Urinary symptoms: frequent urination   Urinary symptoms: no hematuria   Associated symptoms: abdominal pain, flank pain and nausea   Associated symptoms: no fever and no vomiting      HPI Comments: Anna Gonzalez is a 39 y.o. female who presents to the Emergency Department complaining of gradual onset, constant, 3/10, right flank pain and right sided back pain x 3 days. The pain is exacerbated with movement. Pt reports that when moving the pain increases to a 6/10 on the pain scale. Pt also complains of dysuria, mild frequency, and nausea. She has hx of UTIs and states her symptoms feel similar. Denies fever, chills, hematuria, vomiting, or any other associated symptoms.    Past Medical History  Diagnosis Date  . Breast nodule 08/12/2013  . Menorrhagia 08/12/2013  . Dysmenorrhea 08/12/2013  . UI (urinary incontinence) 08/12/2013   Past Surgical History  Procedure Laterality Date  . Oophorectomy    . Appendectomy    . Breast cyst excision    . Cesarean section    . Tubal ligation     Family History  Problem Relation Age of Onset  . Cancer Mother   . Dementia Father   . Bipolar disorder Sister   . Cancer Maternal Grandmother     breast  . Alzheimer's disease  Paternal Grandmother   . Heart attack Paternal Grandfather    Social History  Substance Use Topics  . Smoking status: Former Smoker -- 1.00 packs/day  . Smokeless tobacco: Never Used  . Alcohol Use: Yes     Comment: occasionally   OB History    Gravida Para Term Preterm AB TAB SAB Ectopic Multiple Living   4 3   1  1         Review of Systems  Constitutional: Negative for fever and chills.  HENT: Negative for rhinorrhea and sore throat.   Eyes: Negative for visual disturbance.  Respiratory: Positive for cough. Negative for shortness of breath.   Cardiovascular: Negative for chest pain.  Gastrointestinal: Positive for nausea and abdominal pain. Negative for vomiting and diarrhea.  Genitourinary: Positive for dysuria, frequency and flank pain. Negative for hematuria.  Musculoskeletal: Positive for back pain.  Skin: Negative for rash.  Hematological: Does not bruise/bleed easily.   Allergies  Review of patient's allergies indicates no known allergies.  Home Medications   Prior to Admission medications   Medication Sig Start Date End Date Taking? Authorizing Provider  cephALEXin (KEFLEX) 500 MG capsule Take 1 capsule (500 mg total) by mouth 4 (four) times daily. 02/20/15   Fredia Sorrow, MD  clonazePAM (KLONOPIN) 0.5 MG tablet 1/2-1 po BID prn anxiety 02/02/15   Nilda Simmer, NP  phenazopyridine (PYRIDIUM) 200 MG tablet Take 1 tablet (200 mg total) by mouth 3 (  three) times daily. 02/20/15   Fredia Sorrow, MD  sertraline (ZOLOFT) 50 MG tablet Take 1 tablet (50 mg total) by mouth daily. 02/02/15 02/02/16  Nilda Simmer, NP   Triage Vitals: BP 115/61 mmHg  Pulse 85  Temp(Src) 98.2 F (36.8 C) (Oral)  Resp 16  Ht 5\' 4"  (1.626 m)  Wt 125 lb (56.7 kg)  BMI 21.45 kg/m2  SpO2 99%  LMP 02/19/2015   Physical Exam  Constitutional: She is oriented to person, place, and time. She appears well-developed and well-nourished. No distress.  HENT:  Head: Normocephalic and  atraumatic.  Eyes: Conjunctivae and EOM are normal.  Neck: Neck supple. No tracheal deviation present.  Cardiovascular: Normal rate and regular rhythm.   No murmur heard. Pulmonary/Chest: Effort normal and breath sounds normal. No respiratory distress. She has no wheezes. She has no rhonchi. She has no rales.  Abdominal: Soft. Bowel sounds are normal. There is no tenderness. There is CVA tenderness (Right).  Musculoskeletal: Normal range of motion.  Neurological: She is alert and oriented to person, place, and time.  Skin: Skin is warm and dry.  Psychiatric: She has a normal mood and affect. Her behavior is normal.  Nursing note and vitals reviewed.   ED Course  Procedures (including critical care time)  DIAGNOSTIC STUDIES: Oxygen Saturation is 99% on RA, normal by my interpretation.    COORDINATION OF CARE: 8:21 PM-Discussed treatment plan which includes Rx Pyridium and Keflex with pt at bedside and pt agreed to plan.   Labs Review Labs Reviewed  URINALYSIS, ROUTINE W REFLEX MICROSCOPIC (NOT AT [facility]) - Abnormal; Notable for the following:    APPearance CLOUDY (*)    Hgb urine dipstick LARGE (*)    Protein, ur 100 (*)    Nitrite POSITIVE (*)    Leukocytes, UA MODERATE (*)    All other components within normal limits  URINE MICROSCOPIC-ADD ON - Abnormal; Notable for the following:    Squamous Epithelial / LPF 6-30 (*)    Bacteria, UA MANY (*)    All other components within normal limits  URINE CULTURE   Results for orders placed or performed during the hospital encounter of 02/20/15  Urine culture  Result Value Ref Range   Specimen Description URINE, CLEAN CATCH    Special Requests NONE    Culture      >=100,000 COLONIES/mL ESCHERICHIA COLI Performed at [facility]    Report Status PENDING   Urinalysis, Routine w reflex microscopic (not at [facility])  Result Value Ref Range   Color, Urine YELLOW YELLOW   APPearance CLOUDY (A) CLEAR   Specific Gravity, Urine 1.015  1.005 - 1.030   pH 6.0 5.0 - 8.0   Glucose, UA NEGATIVE NEGATIVE mg/dL   Hgb urine dipstick LARGE (A) NEGATIVE   Bilirubin Urine NEGATIVE NEGATIVE   Ketones, ur NEGATIVE NEGATIVE mg/dL   Protein, ur 100 (A) NEGATIVE mg/dL   Nitrite POSITIVE (A) NEGATIVE   Leukocytes, UA MODERATE (A) NEGATIVE  Urine microscopic-add on  Result Value Ref Range   Squamous Epithelial / LPF 6-30 (A) NONE SEEN   WBC, UA TOO NUMEROUS TO COUNT 0 - 5 WBC/hpf   RBC / HPF TOO NUMEROUS TO COUNT 0 - 5 RBC/hpf   Bacteria, UA MANY (A) NONE SEEN     Imaging Review No results found. I have personally reviewed and evaluated these lab results as part of my medical decision-making.   EKG Interpretation None      MDM  Final diagnoses:  UTI (lower urinary tract infection)   Patient with the urinalysis consistent with urinary tract infection. Patient will be treated with Keflex and peridium.  I personally performed the services described in this documentation, which was scribed in my presence. The recorded information has been reviewed and is accurate.        Fredia Sorrow, MD 02/23/15 [phone]

## 2015-02-20 NOTE — ED Notes (Signed)
Pt alert & oriented x4, stable gait. Patient  given discharge instructions, paperwork & prescription(s). Patient verbalized understanding. Pt left department w/ no further questions. 

## 2015-02-23 LAB — URINE CULTURE: Culture: >=100000

## 2015-02-25 ENCOUNTER — Telehealth (HOSPITAL_COMMUNITY): Payer: Self-pay

## 2015-02-25 NOTE — Telephone Encounter (Signed)
Post ED Visit - Positive Culture Follow-up  Culture report reviewed by antimicrobial stewardship pharmacist:  []  Elenor Quinones, Pharm.D. []  Heide Guile, Pharm.D., BCPS [x]  Parks Neptune, Pharm.D. []  Alycia Rossetti, Pharm.D., BCPS []  [place], Pharm.D., BCPS, AAHIVP []  Legrand Como, Pharm.D., BCPS, AAHIVP []  Milus Glazier, Pharm.D. []  Stephens November, Pharm.D.  Positive urine culture, >/= 100,000 colonies -> E Coli Treated with Cephalexin, organism sensitive to the same and no further patient follow-up is required at this time.   Dortha Kern 02/25/2015, 5:07 AM

## 2015-03-07 ENCOUNTER — Ambulatory Visit (INDEPENDENT_AMBULATORY_CARE_PROVIDER_SITE_OTHER): Payer: 59 | Admitting: Nurse Practitioner

## 2015-03-07 VITALS — Ht 64.0 in

## 2015-03-07 DIAGNOSIS — F419 Anxiety disorder, unspecified: Secondary | ICD-10-CM | POA: Diagnosis not present

## 2015-03-07 DIAGNOSIS — J329 Chronic sinusitis, unspecified: Secondary | ICD-10-CM

## 2015-03-07 DIAGNOSIS — F411 Generalized anxiety disorder: Secondary | ICD-10-CM

## 2015-03-07 DIAGNOSIS — F430 Acute stress reaction: Principal | ICD-10-CM

## 2015-03-07 MED ORDER — CLONAZEPAM 0.5 MG PO TABS: ORAL_TABLET | ORAL | Status: DC

## 2015-03-07 MED ORDER — AZITHROMYCIN 250 MG PO TABS: ORAL_TABLET | ORAL | Status: DC

## 2015-03-20 ENCOUNTER — Encounter: Payer: Self-pay | Admitting: Nurse Practitioner

## 2015-03-20 NOTE — Progress Notes (Signed)
Subjective:  Presents for recheck of her anxiety. Stopped taking Zoloft due to side effects. States she just "didn't feel right". Takes an occasional Klonopin only for extreme anxiety. Also c/o cough x 3 weeks. Producing yellow mucus. Headache only with cough. No fever, sore throat or ear pain. Still some difficulty going to sleep.   Objective:   Ht 5\' 4"  (1.626 m)  LMP 02/19/2015 NAD. Alert, oriented. Cheerful affect. Thoughts logical, coherent and relevant. Dressed appropriately. TMs clear effusion. Pharynx no erythema; PND noted. Neck supple with mild anterior adenopathy. Lungs clear. Heart RRR.    Assessment:  Problem List Items Addressed This Visit      Other   Anxiety as acute reaction to exceptional stress - Primary    Other Visit Diagnoses    Rhinosinusitis        Relevant Medications    azithromycin (ZITHROMAX Z-PAK) 250 MG tablet      Plan:  Meds ordered this encounter  Medications  . clonazePAM (KLONOPIN) 0.5 MG tablet    Sig: 1/2-1 po BID prn anxiety    Dispense:  30 tablet    Refill:  5    May refill monthly    Order Specific Question:  Supervising Provider    Answer:  Mikey Kirschner [[id number]]  . azithromycin (ZITHROMAX Z-PAK) 250 MG tablet    Sig: Take 2 tablets (500 mg) on  Day 1,  followed by 1 tablet (250 mg) once daily on Days 2 through 5.    Dispense:  6 each    Refill:  0    Order Specific Question:  Supervising Provider    Answer:  Mikey Kirschner [[id number]]   Return in about 6 months (around 09/05/2015) for recheck. reminded patient about preventive health physical.

## 2015-05-31 ENCOUNTER — Encounter: Payer: Self-pay | Admitting: Nurse Practitioner

## 2015-05-31 ENCOUNTER — Ambulatory Visit (INDEPENDENT_AMBULATORY_CARE_PROVIDER_SITE_OTHER): Payer: 59 | Admitting: Nurse Practitioner

## 2015-05-31 ENCOUNTER — Encounter: Payer: Self-pay | Admitting: Family Medicine

## 2015-05-31 VITALS — BP 122/74 | Temp 98.9°F | Ht 63.0 in | Wt 130.0 lb

## 2015-05-31 DIAGNOSIS — R062 Wheezing: Secondary | ICD-10-CM | POA: Diagnosis not present

## 2015-05-31 DIAGNOSIS — J111 Influenza due to unidentified influenza virus with other respiratory manifestations: Secondary | ICD-10-CM

## 2015-05-31 DIAGNOSIS — J208 Acute bronchitis due to other specified organisms: Secondary | ICD-10-CM

## 2015-05-31 DIAGNOSIS — B9689 Other specified bacterial agents as the cause of diseases classified elsewhere: Secondary | ICD-10-CM

## 2015-05-31 DIAGNOSIS — J00 Acute nasopharyngitis [common cold]: Secondary | ICD-10-CM | POA: Diagnosis not present

## 2015-05-31 MED ORDER — ALBUTEROL SULFATE (2.5 MG/3ML) 0.083% IN NEBU
2.5000 mg | INHALATION_SOLUTION | Freq: Once | RESPIRATORY_TRACT | Status: AC
  Administered 2015-05-31: 2.5 mg via RESPIRATORY_TRACT

## 2015-05-31 MED ORDER — PREDNISONE 20 MG PO TABS: ORAL_TABLET | ORAL | Status: DC

## 2015-05-31 MED ORDER — HYDROCODONE-HOMATROPINE 5-1.5 MG/5ML PO SYRP: 5.0000 mL | ORAL_SOLUTION | ORAL | Status: DC | PRN

## 2015-05-31 MED ORDER — AZITHROMYCIN 250 MG PO TABS: ORAL_TABLET | ORAL | Status: DC

## 2015-05-31 MED ORDER — ALBUTEROL SULFATE HFA 108 (90 BASE) MCG/ACT IN AERS: 2.0000 | INHALATION_SPRAY | RESPIRATORY_TRACT | Status: DC | PRN

## 2015-05-31 NOTE — Progress Notes (Signed)
Subjective:  Presents for complaints of fever cough achiness and fatigue that began 3 days ago. Headache. No sore throat. No wheezing. Producing yellow mucus at times. Symptoms are slightly better today. Smoker. Taking fluids well. Voiding normal limit.  Objective:   BP 122/74 mmHg  Temp(Src) 98.9 F (37.2 C) (Oral)  Ht 5\' 3"  (1.6 m)  Wt 130 lb (58.968 kg)  BMI 23.03 kg/m2 NAD. Alert, oriented. Fatigued in appearance. Skin warm to the touch. TMs clear effusion, no erythema. Pharynx mildly injected. Neck supple with mild soft anterior adenopathy. Lungs initially faint scattered expiratory wheezes noted throughout lung fields. No tachypnea. Normal color. Given albuterol 2.5 mg nebulizer treatment. Wheezing completely resolved with rare expiratory crackles noted. Heart regular rate rhythm.  Assessment: Influenza  Wheezing - Plan: albuterol (PROVENTIL) (2.5 MG/3ML) 0.083% nebulizer solution 2.5 mg  Acute bacterial bronchitis  Plan:  Meds ordered this encounter  Medications  . albuterol (PROVENTIL) (2.5 MG/3ML) 0.083% nebulizer solution 2.5 mg    Sig:   . azithromycin (ZITHROMAX Z-PAK) 250 MG tablet    Sig: Take 2 tablets (500 mg) on  Day 1,  followed by 1 tablet (250 mg) once daily on Days 2 through 5.    Dispense:  6 each    Refill:  0    Order Specific Question:  Supervising Provider    Answer:  Mikey Kirschner [[id number]]  . predniSONE (DELTASONE) 20 MG tablet    Sig: 3 po qd x 3 d then 2 po qd x 3 d then 1 po qd x 3 d    Dispense:  18 tablet    Refill:  0    Order Specific Question:  Supervising Provider    Answer:  Mikey Kirschner [[id number]]  . albuterol (PROVENTIL HFA;VENTOLIN HFA) 108 (90 Base) MCG/ACT inhaler    Sig: Inhale 2 puffs into the lungs every 4 (four) hours as needed.    Dispense:  1 Inhaler    Refill:  0    Order Specific Question:  Supervising Provider    Answer:  Mikey Kirschner [[id number]]  . HYDROcodone-homatropine (HYCODAN) 5-1.5 MG/5ML syrup    Sig: Take 5 mLs by  mouth every 4 (four) hours as needed.    Dispense:  120 mL    Refill:  0    Order Specific Question:  Supervising Provider    Answer:  Mikey Kirschner [[id number]]   Warning signs reviewed. Call back in 4 days if no improvement, call or go to ED sooner if worse.

## 2015-11-08 ENCOUNTER — Telehealth: Payer: Self-pay | Admitting: Nurse Practitioner

## 2015-11-08 NOTE — Telephone Encounter (Signed)
[facility]

## 2015-11-08 NOTE — Telephone Encounter (Signed)
clonazePAM (KLONOPIN) 0.5 MG tablet  Pt needs refill on this due to stresses in her life if she can get a refill. She will be out in a few

## 2015-11-08 NOTE — Telephone Encounter (Signed)
Ok times one 

## 2015-11-09 MED ORDER — CLONAZEPAM 0.5 MG PO TABS: ORAL_TABLET | ORAL | 1 refills | Status: DC

## 2015-11-30 ENCOUNTER — Ambulatory Visit: Payer: 59 | Admitting: Nurse Practitioner

## 2017-07-24 ENCOUNTER — Encounter: Payer: Self-pay | Admitting: Nurse Practitioner

## 2017-07-24 ENCOUNTER — Ambulatory Visit: Payer: 59 | Admitting: Nurse Practitioner

## 2017-07-24 VITALS — BP 110/80 | Ht 63.0 in | Wt 124.0 lb

## 2017-07-24 DIAGNOSIS — F411 Generalized anxiety disorder: Secondary | ICD-10-CM

## 2017-07-24 DIAGNOSIS — F430 Acute stress reaction: Secondary | ICD-10-CM

## 2017-07-24 MED ORDER — CITALOPRAM HYDROBROMIDE 20 MG PO TABS: ORAL_TABLET | ORAL | 2 refills | Status: DC

## 2017-07-24 MED ORDER — CLONAZEPAM 0.5 MG PO TABS: ORAL_TABLET | ORAL | 0 refills | Status: DC

## 2017-07-24 NOTE — Progress Notes (Signed)
Subjective:  Presents to discuss extreme anxiety due to family situation. Her father has dementia and she has been involved in getting him care. Will also be having temporary custody of her 44 year old nephew. Has done some counseling through work but states this did not help.  GAD 7 : Generalized Anxiety Score 07/24/2017  Nervous, Anxious, on Edge 3  Control/stop worrying 3  Worry too much - different things 3  Trouble relaxing 3  Restless 3  Easily annoyed or irritable 3  Afraid - awful might happen 3  Total GAD 7 Score 21  Anxiety Difficulty Somewhat difficult   Depression screen PHQ 2/9 07/24/2017  Decreased Interest 1  Down, Depressed, Hopeless 0  PHQ - 2 Score 1  Altered sleeping 1  Tired, decreased energy 3  Change in appetite 0  Feeling bad or failure about yourself  2  Trouble concentrating 3  Moving slowly or fidgety/restless 2  Suicidal thoughts 0  PHQ-9 Score 12  Difficult doing work/chores Somewhat difficult   Has increased her smoking to about 1 1/2 ppd. Had quit for about 3 months until stress level increased. Has quit up to 3 years in the past.  Has taken Klonopin on a prn basis in the past which worked well. Could not take Xanax due to drowsiness.  Denies suicidal or homicidal thoughts or ideation.  Objective:   BP 110/80   Ht 5\' 3"  (1.6 m)   Wt 124 lb 0.8 oz (56.3 kg)   BMI 21.97 kg/m  NAD. Alert, oriented. Mildly anxious affect. Thoughts logical, coherent and relevant. Dressed appropriately. Making good eye contact. Lungs clear. Heart RRR.   Assessment:   Problem List Items Addressed This Visit      Other   Anxiety as acute reaction to exceptional stress - Primary   Relevant Medications   citalopram (CELEXA) 20 MG tablet       Plan:   Meds ordered this encounter  Medications  . citalopram (CELEXA) 20 MG tablet    Sig: Take 1/2 tab po qhs x 6 d then one po qhs    Dispense:  30 tablet    Refill:  2    Order Specific Question:   Supervising Provider     Answer:   Mikey Kirschner [[id number]]  . clonazePAM (KLONOPIN) 0.5 MG tablet    Sig: 1/2-1 po BID prn anxiety    Dispense:  40 tablet    Refill:  0    Order Specific Question:   Supervising Provider    Answer:   Maggie Font   Reviewed potential adverse effects of Celexa. DC med and call if any problems.  Patient agrees the first goal is to get her anxiety under better control. We will readdress smoking cessation at a later date. In the meantime, recommend she cut back on smoking with a goal of less than 1/2 ppd. Recommend regular activity such as walking.  Return in about 1 month (around 08/24/2017), or recheck.  25 minutes was spent with the patient.  This statement verifies that 25 minutes was indeed spent with the patient. Greater than half the time was spent in discussion, counseling and answering questions  regarding the issues that the patient came in for today as reflected in the diagnosis (s) please refer to documentation for further details.

## 2017-08-28 ENCOUNTER — Ambulatory Visit: Payer: 59 | Admitting: Nurse Practitioner

## 2017-08-28 ENCOUNTER — Encounter: Payer: Self-pay | Admitting: Nurse Practitioner

## 2017-08-28 DIAGNOSIS — F411 Generalized anxiety disorder: Secondary | ICD-10-CM | POA: Diagnosis not present

## 2017-08-28 DIAGNOSIS — F430 Acute stress reaction: Secondary | ICD-10-CM

## 2017-08-28 DIAGNOSIS — N6310 Unspecified lump in the right breast, unspecified quadrant: Secondary | ICD-10-CM | POA: Diagnosis not present

## 2017-08-28 NOTE — Progress Notes (Signed)
Subjective:  Presents for recheck on anxiety. Currently on Celexa 10 mg which has greatly helped her anxiety and irritability. Could not increase to 20 mg due to drowsiness. Takes 1/2 Klonopin occasionally for severe anxiety. Also c/o a "knot" in the right breast for the past few weeks. No change in size but now tender. History of benign cysts. [place]: her mgmother had breast cancer.   Objective:   BP 120/80   Ht 5\' 3"  (1.6 m)   Wt 125 lb 0.6 oz (56.7 kg)   BMI 22.15 kg/m  NAD. Alert, oriented. Calmer affect. Thoughts logical, coherent and relevant. Dressed appropriately. Lungs clear. Heart RRR. Breasts: dense tissue bilat; very firm nodule noted approximately 11 o'clock in the right breast just outside of the areola. Has what appears to be a small calcification on top. Axillae no adenopathy. Right nipple no discharge.   Last diagnostic mammogram bilateral 08/24/13.   Assessment:   Problem List Items Addressed This Visit      Other   Anxiety as acute reaction to exceptional stress    Other Visit Diagnoses    Mass of right breast       Relevant Orders   MM DIAG BREAST TOMO BILATERAL   US BREAST LTD UNI LEFT INC AXILLA   US BREAST LTD UNI RIGHT INC AXILLA       Plan:  Continue current meds as directed.  Mammogram pending. Return in about 6 months (around 02/27/2018) for recheck.

## 2017-09-01 ENCOUNTER — Ambulatory Visit (HOSPITAL_COMMUNITY)
Admission: RE | Admit: 2017-09-01 | Discharge: 2017-09-01 | Disposition: A | Discharge status: Home / Self Care | Payer: 59 | Source: Ambulatory Visit | Attending: Nurse Practitioner | Admitting: Nurse Practitioner

## 2017-09-01 DIAGNOSIS — N6011 Diffuse cystic mastopathy of right breast: Secondary | ICD-10-CM | POA: Diagnosis not present

## 2017-09-01 DIAGNOSIS — R922 Inconclusive mammogram: Secondary | ICD-10-CM | POA: Diagnosis not present

## 2017-09-01 DIAGNOSIS — N6012 Diffuse cystic mastopathy of left breast: Secondary | ICD-10-CM | POA: Diagnosis not present

## 2017-09-01 DIAGNOSIS — N6310 Unspecified lump in the right breast, unspecified quadrant: Secondary | ICD-10-CM | POA: Insufficient documentation

## 2017-11-17 ENCOUNTER — Encounter (HOSPITAL_COMMUNITY): Payer: Self-pay | Admitting: Emergency Medicine

## 2017-11-17 ENCOUNTER — Emergency Department (HOSPITAL_COMMUNITY)
Admission: EM | Admit: 2017-11-17 | Discharge: 2017-11-17 | Disposition: A | Discharge status: Home / Self Care | Payer: 59 | Attending: Emergency Medicine | Admitting: Emergency Medicine

## 2017-11-17 ENCOUNTER — Other Ambulatory Visit: Payer: Self-pay

## 2017-11-17 DIAGNOSIS — F17200 Nicotine dependence, unspecified, uncomplicated: Secondary | ICD-10-CM | POA: Diagnosis not present

## 2017-11-17 DIAGNOSIS — N939 Abnormal uterine and vaginal bleeding, unspecified: Secondary | ICD-10-CM | POA: Diagnosis not present

## 2017-11-17 DIAGNOSIS — Z79899 Other long term (current) drug therapy: Secondary | ICD-10-CM | POA: Insufficient documentation

## 2017-11-17 LAB — BASIC METABOLIC PANEL
ANION GAP: 9 (ref 5–15)
BUN: 10 mg/dL (ref 6–20)
CO2: 26 mmol/L (ref 22–32)
Calcium: 9.4 mg/dL (ref 8.9–10.3)
Chloride: 106 mmol/L (ref 98–111)
Creatinine, Ser: 0.72 mg/dL (ref 0.44–1.00)
GFR calc Af Amer: >60 mL/min (ref >60)
GFR calc non Af Amer: >60 mL/min (ref >60)
GLUCOSE: 71 mg/dL (ref 70–99)
POTASSIUM: 3.7 mmol/L (ref 3.5–5.1)
Sodium: 141 mmol/L (ref 135–145)

## 2017-11-17 LAB — CBC
HEMATOCRIT: 40.2 % (ref 36.0–46.0)
HEMOGLOBIN: 13.2 g/dL (ref 12.0–15.0)
MCH: 31.1 pg (ref 26.0–34.0)
MCHC: 32.8 g/dL (ref 30.0–36.0)
MCV: 94.6 fL (ref 78.0–100.0)
Platelets: 222 10*3/uL (ref 150–400)
RBC: 4.25 MIL/uL (ref 3.87–5.11)
RDW: 13.6 % (ref 11.5–15.5)
WBC: 9.2 10*3/uL (ref 4.0–10.5)

## 2017-11-17 LAB — WET PREP, GENITAL
Clue Cells Wet Prep HPF POC: NONE SEEN
SPERM: NONE SEEN
TRICH WET PREP: NONE SEEN
YEAST WET PREP: NONE SEEN

## 2017-11-17 LAB — I-STAT BETA HCG BLOOD, ED (MC, WL, AP ONLY): I-stat hCG, quantitative: <5 m[IU]/mL (ref <5)

## 2017-11-17 NOTE — ED Provider Notes (Signed)
Patient placed in Quick Look pathway, seen and evaluated   Chief Complaint: Vaginal bleeding  HPI:   Patient with menstrual bleeding for 19 days straight. One week off prior to that, preceeded by 8 days of bleeding. Soaking through 5 pads daily.  States "when I go to the bathroom the blood just pours out of me ".  Feels generally weak and fatigued, denies syncope or fevers.  No lightheadedness.  Notes intermittent mild cramping sensation of the left lower quadrant.  Right ovary surgically removed secondary to large cyst several years ago.  Notes that she has had a tubal ligation to the left fallopian tube.  ROS: positive for vaginal bleeding, fatigue Negative for nausea, vomiting, syncope  Physical Exam:   Gen: No distress  Neuro: Awake and Alert  Skin: Warm    Focused Exam: Active bowel sounds in all 4 quadrants.  Abdomen soft and nontender.  Patient resting comfortably.   Initiation of care has begun. The patient has been counseled on the process, plan, and necessity for staying for the completion/evaluation, and the remainder of the medical screening examination    Debroah Baller 11/17/17 Benton, Kevin, MD 11/17/17 [phone]

## 2017-11-17 NOTE — ED Triage Notes (Signed)
Pt arrives to ED with vaginal bleeding with 19 days of menstrual cycle. Pt states she feels week and tired. Pt does have some lower abd cramping.

## 2017-11-17 NOTE — Discharge Instructions (Addendum)
The lab tests performed during your visit do not show any signs of low hemoglobin (blood level) and the pregnancy test was negative. I have placed a referral to the [facility]. Please follow-up with the ObGyn for follow-up of your bleeding. Return to the ED with any new of concerning symptoms such as lightheadedness, dizziness, syncope, SOB abdominal pain.   Get help right away if: You pass out. You have to change pads every hour. You have belly (abdominal) pain. You have a fever. You get sweaty. You get weak. You passing large blood clots from your vagina.

## 2017-11-17 NOTE — ED Notes (Signed)
ED Provider at bedside. 

## 2017-11-17 NOTE — ED Notes (Signed)
Pt verbalized understanding of discharge instructions and denies any further questions at this time.   

## 2017-11-17 NOTE — ED Provider Notes (Signed)
[facility] EMERGENCY DEPARTMENT Provider Note   CSN: [id number] Arrival date & time: 11/17/17  1408   History   Chief Complaint Chief Complaint  Patient presents with  . Vaginal Bleeding    HPI Anna Gonzalez is a 42 y.o. female with a past medical history significant for dysmenorrhea, menorrhagia, uterine fibroid with hx c section and right ovary removal who presents for evaluation of abnormal vaginal bleeding. Normal cycles last 7 days. States her menstrual cycle started 19 days ago and was heavy for the first week and lessened after that however her cycle has failed to stop completely. Going through 4 heavy pads a day. Denies fever, chills, lightheadedness, dizziness, headache, SOB, chest pain, abdominal pain, vaginal discharge, vaginal pain, GU trauma, diarrhea, constipation, dysuria, weakness, numbness, tingling. Bleeding has been gradually decreasing since onset. Was seeing [facility] in [place] for managment of her dysmenorrhea and menorrhagia however does not wish to return that practice for follow-up. She has made an appointment for a new ObGyn however that appointment is not until Sept. 10 and states she "cannot wait that long." Admits to a history of uterine fibroids. States at one point there was talk about a surgical correction for her abnormal bleeding but did not follow up. No history of DVT, PE. Is a current smoker. Possible family history of a clotting disorder however patient is not positive. Patient admits to feeling "more tired" recently however, states that she is not sleeping well secondary to family and work issues. This began approximately 1 month ago.   Past Medical History:  Diagnosis Date  . Breast nodule 08/12/2013  . Dysmenorrhea 08/12/2013  . Menorrhagia 08/12/2013  . UI (urinary incontinence) 08/12/2013    Patient Active Problem List   Diagnosis Date Noted  . Anxiety as acute reaction to exceptional stress 02/05/2015  . 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 Menorrhagia 09/01/2013  . Dyspareunia 09/01/2013  . Dysmenorrhea 09/01/2013  . Breast nodule 08/12/2013  . Menorrhagia 08/12/2013  . Dysmenorrhea 08/12/2013  . UI (urinary incontinence) 08/12/2013  . Back pain 01/06/2011  . Neck pain 01/06/2011  . NICOTINE ADDICTION 12/28/2008  . FATIGUE 12/28/2008  . UNSPECIFIED URINARY INCONTINENCE 12/28/2008    Past Surgical History:  Procedure Laterality Date  . APPENDECTOMY    . BREAST CYST EXCISION    . CESAREAN SECTION    . OOPHORECTOMY    . TUBAL LIGATION       OB History    Gravida  4   Para  3   Term      Preterm      AB  1   Living        SAB  1   TAB      Ectopic      Multiple      Live Births               Home Medications    Prior to Admission medications   Medication Sig Start Date End Date Taking? Authorizing Provider  citalopram (CELEXA) 20 MG tablet Take 1/2 tab po qhs x 6 d then one po qhs 07/24/17   Nilda Simmer, NP  clonazePAM (KLONOPIN) 0.5 MG tablet 1/2-1 po BID prn anxiety 07/24/17   Nilda Simmer, NP    Family History Family History  Problem Relation Age of Onset  . Cancer Mother   . Dementia Father   . Bipolar disorder Sister   . Cancer Maternal Grandmother  breast  . Alzheimer's disease Paternal Grandmother   . Heart attack Paternal Grandfather     Social History Social History   Tobacco Use  . Smoking status: Current Every Day Smoker    Packs/day: 1.00  . Smokeless tobacco: Never Used  Substance Use Topics  . Alcohol use: Yes    Comment: occasionally  . Drug use: No     Allergies   Zoloft [sertraline hcl]   Review of Systems Review of Systems  Constitutional: Negative.   Respiratory: Negative.   Cardiovascular: Negative.   Gastrointestinal: Negative.   Genitourinary: Positive for menstrual problem and vaginal bleeding. Negative for decreased urine volume, difficulty urinating, dysuria, flank pain,  frequency, genital sores, hematuria, pelvic pain, urgency, vaginal discharge and vaginal pain.  Musculoskeletal: Negative.   Skin: Negative.   Neurological: Negative.   Hematological: Negative.   All other systems reviewed and are negative.    Physical Exam Updated Vital Signs BP 115/62   Pulse 92   Temp 98.8 F (37.1 C) (Oral)   Resp 16   LMP 11/03/2017   SpO2 99%   Physical Exam  Constitutional: She appears well-developed and well-nourished. No distress.  HENT:  Head: Normocephalic and atraumatic.  Mouth/Throat: Oropharynx is clear and moist and mucous membranes are normal.  Eyes: Pupils are equal, round, and reactive to light. Conjunctivae are normal.  Neck: Normal range of motion. Neck supple.  Cardiovascular: Normal rate, regular rhythm and normal heart sounds.  No murmur heard. Pulmonary/Chest: Effort normal. No accessory muscle usage. No respiratory distress.  Abdominal: Soft. Bowel sounds are normal. She exhibits no distension and no mass. There is no hepatosplenomegaly. There is no tenderness. There is no rigidity, no rebound and no guarding. No hernia. Hernia confirmed negative in the right inguinal area and confirmed negative in the left inguinal area.  C section scar.   Genitourinary: Pelvic exam was performed with patient supine. No labial fusion. There is no rash, tenderness, lesion or injury on the right labia. There is no rash, tenderness, lesion or injury on the left labia. Uterus is not enlarged, not fixed and not tender. Cervix exhibits no motion tenderness, no discharge and no friability. Right adnexum displays no mass, no tenderness and no fullness. Left adnexum displays no mass, no tenderness and no fullness. There is bleeding in the vagina. No erythema or tenderness in the vagina. No foreign body in the vagina. No signs of injury around the vagina. No vaginal discharge found.  Genitourinary Comments: Mild bright red blood in the cervical vault. No clots. No  evidence of lacerations, lesions or GU trauma.   Musculoskeletal: Normal range of motion.  Lymphadenopathy: No inguinal adenopathy noted on the right or left side.  Neurological: She is alert.  Skin: Skin is warm and dry. She is not diaphoretic.  Psychiatric: She has a normal mood and affect.  Nursing note and vitals reviewed. Pelvic exam performed with CNA in room as chaperone.  ED Treatments / Results  Labs (all labs ordered are listed, but only abnormal results are displayed) Labs Reviewed  WET PREP, GENITAL - Abnormal; Notable for the following components:      Result Value   WBC, Wet Prep HPF POC MODERATE (*)    All other components within normal limits  CBC  BASIC METABOLIC PANEL  I-STAT BETA HCG BLOOD, ED ([facility], [facility], [facility] ONLY)  GC/CHLAMYDIA PROBE AMP ([place]) NOT AT [facility]  WET PREP  (BD AFFIRM) ([place])    EKG None  Radiology No results found.  Procedures Procedures (including critical care time)  Medications Ordered in ED Medications - No data to display   Initial Impression / Assessment and Plan / ED Course  I have reviewed the triage vital signs and the nursing notes as well as past medical history.  Pertinent labs & imaging results that were available during my care of the patient were reviewed by me and considered in my medical decision making (see chart for details).  Patient presents for evaluation of abnormal vaginal bleeding. Hx of dysmenorrhea and menorrhagia. Current cycle at day 19. Changing pad 4x day. Abdomen soft, non tender to palpation, bowel sounds in 4 quadrants. Non surgical abdomen. No lightheadedness or dizziness with ambulation or movement. Will obtain labs to check for anemia and pregnancy to r/o ectopic. Will perform pelvic exam. Will draw GC/Chlmydia/ Wet prep however patient does not want prophylactic treatment at this time and will follow-up with the results with ObGyn. Does not want HIV/Syphillis testing at this time. Pelvic exam  shows mild bright red blood at the cervix, no signs of trauma or lesions. No uterine or adnexal tenderness.  Hgb 13.2, hCG negative, VSS. Discussed with patient about possible estrogen for continued bleeding however she is a current smoker and states she thinks she may have an immediate family member that has a clotting disorder with 'leg swelling' at one point. Given this history will refer patient to outpatient ObGyn for her bleeding as she is hemodynamically stable at this time and has only mild bleeding from the cervix. Patient stable for discharge at home. Discussed return precautions as well as reasons to immediately return to the ED. Patient and friend voiced understanding    Final Clinical Impressions(s) / ED Diagnoses   Final diagnoses:  Abnormal vaginal bleeding    ED Discharge Orders         Ordered    Ambulatory referral to Gynecology    Comments:  abnormal vaginal bleeding x 19 days   11/17/17 1532           Jaanvi Fizer A, PA-C 11/17/17 1551    Sherwood Gambler, MD 11/17/17 2236

## 2017-11-18 LAB — GC/CHLAMYDIA PROBE AMP (~~LOC~~) NOT AT ARMC
Chlamydia: NEGATIVE
NEISSERIA GONORRHEA: NEGATIVE

## 2017-11-19 ENCOUNTER — Ambulatory Visit: Payer: 59 | Admitting: Obstetrics and Gynecology

## 2017-11-19 ENCOUNTER — Encounter: Payer: Self-pay | Admitting: Obstetrics and Gynecology

## 2017-11-19 ENCOUNTER — Other Ambulatory Visit: Payer: Self-pay

## 2017-11-19 VITALS — BP 112/66 | HR 72 | Resp 18 | Ht 63.75 in | Wt 130.0 lb

## 2017-11-19 DIAGNOSIS — N921 Excessive and frequent menstruation with irregular cycle: Secondary | ICD-10-CM | POA: Diagnosis not present

## 2017-11-19 DIAGNOSIS — N8110 Cystocele, unspecified: Secondary | ICD-10-CM | POA: Diagnosis not present

## 2017-11-19 DIAGNOSIS — D219 Benign neoplasm of connective and other soft tissue, unspecified: Secondary | ICD-10-CM | POA: Diagnosis not present

## 2017-11-19 DIAGNOSIS — N393 Stress incontinence (female) (male): Secondary | ICD-10-CM | POA: Diagnosis not present

## 2017-11-19 MED ORDER — MEDROXYPROGESTERONE ACETATE 10 MG PO TABS: 10.0000 mg | ORAL_TABLET | Freq: Every day | ORAL | 0 refills | Status: DC

## 2017-11-19 NOTE — Progress Notes (Signed)
GYNECOLOGY  VISIT   HPI: 42 y.o.   Legally Separated  Caucasian  female   [phone] with Patient's last menstrual period was 10/29/2017.   Here as a new patient referred from [facility] for abnormal uterine bleeding x 21 days; per patient bleeding for a week then bleeding stopped for about a week and started back. Per patient has some clots.  Female friend present for the visit today.  Went to ER for evaluation of bleeding on 11/17/17.  Hgb 13.2.  HCG neg. No pelvic US done.   Menses are getting heavier, longer, and cramping.  At one point skipped 2 months, and then bled for a whole month.  Out of last 60 days, not bleeding for 10 days.  Pad changed 5 times per day.  Can feel it coming out when she stands up.   [place] in 2015 showed adenomyosis and one fibroid 3.6 cm.   Declines future childbearing and wants hysterectomy.   Bladder dropped.  Leaks with cough, laugh, and sneeze.  Leaks with walking.  Not able to exercise.  No leak for no reason at all.   Constipation a lot.  No splinting.  No fecal incontinence.  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   GYNECOLOGIC HISTORY: Patient's last menstrual period was 10/29/2017. Contraception:  Tubal ligation Menopausal hormone therapy:  none Last mammogram:  09/01/17 Bilateral Diagnostic MM/US of right and left breasts -- BIRADS 2 benign/density c Last pap smear:   08/12/13 Neg:Neg HR HPV        OB History    Gravida  4   Para  3   Term  0   Preterm  0   AB  1   Living  3     SAB  1   TAB  0   Ectopic  0   Multiple  0   Live Births  0              Patient Active Problem List   Diagnosis Date Noted  . Anxiety as acute reaction to exceptional stress 02/05/2015  . Mixed incontinence urge and stress 09/01/2013  . Menorrhagia 09/01/2013  . Dyspareunia 09/01/2013  . Dysmenorrhea 09/01/2013  . Breast nodule 08/12/2013  . Menorrhagia 08/12/2013  . Dysmenorrhea 08/12/2013  . UI (urinary incontinence)  08/12/2013  . Back pain 01/06/2011  . Neck pain 01/06/2011  . NICOTINE ADDICTION 12/28/2008  . FATIGUE 12/28/2008  . UNSPECIFIED URINARY INCONTINENCE 12/28/2008    Past Medical History:  Diagnosis Date  . Anxiety   . Breast nodule 08/12/2013  . Depression   . Dysmenorrhea 08/12/2013  . Menorrhagia 08/12/2013  . UI (urinary incontinence) 08/12/2013    Past Surgical History:  Procedure Laterality Date  . APPENDECTOMY    . BREAST CYST EXCISION    . CESAREAN SECTION    . OOPHORECTOMY Right   . TUBAL LIGATION      Current Outpatient Medications  Medication Sig Dispense Refill  . clonazePAM (KLONOPIN) 0.5 MG tablet 1/2-1 po BID prn anxiety 40 tablet 0   No current facility-administered medications for this visit.      ALLERGIES: Zoloft [sertraline hcl]  Family History  Problem Relation Age of Onset  . Dementia Father   . Bipolar disorder Sister   . Cancer Maternal Grandmother        breast  . Stroke Maternal Grandfather   . Alzheimer's disease Paternal Grandmother   . Heart attack Paternal Grandfather     Social History  Socioeconomic History  . Marital status: Legally Separated    Spouse name: Not on file  . Number of children: Not on file  . Years of education: Not on file  . Highest education level: Not on file  Occupational History  . Not on file  Social Needs  . Financial resource strain: Not on file  . Food insecurity:    Worry: Not on file    Inability: Not on file  . Transportation needs:    Medical: Not on file    Non-medical: Not on file  Tobacco Use  . Smoking status: Current Every Day Smoker    Packs/day: 1.00  . Smokeless tobacco: Never Used  Substance and Sexual Activity  . Alcohol use: Yes    Comment: occasionally  . Drug use: Never  . Sexual activity: Yes    Birth control/protection: None, Surgical  Lifestyle  . Physical activity:    Days per week: Not on file    Minutes per session: Not on file  . Stress: Not on file   Relationships  . Social connections:    Talks on phone: Not on file    Gets together: Not on file    Attends religious service: Not on file    Active member of club or organization: Not on file    Attends meetings of clubs or organizations: Not on file    Relationship status: Not on file  . Intimate partner violence:    Fear of current or ex partner: Not on file    Emotionally abused: Not on file    Physically abused: Not on file    Forced sexual activity: Not on file  Other Topics Concern  . Not on file  Social History Narrative  . Not on file    Review of Systems  Genitourinary: Positive for vaginal bleeding.  All other systems reviewed and are negative.   PHYSICAL EXAMINATION:    BP 112/66 (BP Location: Right Arm, Patient Position: Sitting, Cuff Size: Normal)   Pulse 72   Resp 18   Ht 5' 3.75" (1.619 m)   Wt 130 lb (59 kg)   LMP 10/29/2017   BMI 22.49 kg/m     General appearance: alert, cooperative and appears stated age Head: Normocephalic, without obvious abnormality, atraumatic Neck: no adenopathy, supple, symmetrical, trachea midline and thyroid normal to inspection and palpation Lungs: clear to auscultation bilaterally Breasts: normal appearance, no masses or tenderness, No nipple retraction or dimpling, No nipple discharge or bleeding, No axillary or supraclavicular adenopathy Heart: regular rate and rhythm Abdomen: soft, non-tender, no masses,  no organomegaly Extremities: extremities normal, atraumatic, no cyanosis or edema Skin: Skin color, texture, turgor normal. No rashes or lesions Lymph nodes: Cervical, supraclavicular, and axillary nodes normal. No abnormal inguinal nodes palpated Neurologic: Grossly normal  Pelvic: External genitalia:  no lesions              Urethra:  normal appearing urethra with no masses, tenderness or lesions              Bartholins and Skenes: normal                 Vagina: normal appearing vagina with normal color and  discharge, no lesions.  First degree cystocele.  Minimal rectocele.               Cervix: no lesions.  Vaginal bleeding noted.  Bimanual Exam:  Uterus: 12 week size, heavy, and extends posteriorly.              Adnexa: no mass, fullness, tenderness              Rectal exam: Yes.  .  Confirms.              Anus:  normal sphincter tone, no lesions  Chaperone was present for exam.  ASSESSMENT  Menorrhagia with irregular menses. Fibroids. Enlarged uterus.  Cystocele.  Minimal Rectocele.  Stress incontinence.   Status post BTL.  Status post right oophorectomy for dermoid cyst.  Status post C/S x 1 for breech, last delivery.    Smoker.   PLAN  We discussed abnormal uterine bleeding and fibroids.  Provera 10 mg x 10 days.  Return for pelvic US and EMB.  Needs pap when not bleeding.  Possible candidate for Depo Lupron.  TSH.  HO on hysterectomy and surgery for stress incontinence.  Will need urodynamic testing.  Questions invited and answered.  An After Visit Summary was printed and given to the patient.  __60____ minutes face to face time of which over 50% was spent in counseling.

## 2017-11-20 LAB — TSH: TSH: 0.741 u[IU]/mL (ref 0.450–4.500)

## 2017-11-26 ENCOUNTER — Ambulatory Visit (INDEPENDENT_AMBULATORY_CARE_PROVIDER_SITE_OTHER): Payer: 59 | Admitting: Obstetrics and Gynecology

## 2017-11-26 ENCOUNTER — Ambulatory Visit (INDEPENDENT_AMBULATORY_CARE_PROVIDER_SITE_OTHER): Payer: 59

## 2017-11-26 ENCOUNTER — Other Ambulatory Visit: Payer: Self-pay

## 2017-11-26 ENCOUNTER — Encounter: Payer: Self-pay | Admitting: Obstetrics and Gynecology

## 2017-11-26 VITALS — BP 102/66 | HR 76 | Resp 14 | Ht 63.75 in | Wt 128.0 lb

## 2017-11-26 DIAGNOSIS — N921 Excessive and frequent menstruation with irregular cycle: Secondary | ICD-10-CM

## 2017-11-26 DIAGNOSIS — N83202 Unspecified ovarian cyst, left side: Secondary | ICD-10-CM

## 2017-11-26 DIAGNOSIS — D219 Benign neoplasm of connective and other soft tissue, unspecified: Secondary | ICD-10-CM | POA: Diagnosis not present

## 2017-11-26 MED ORDER — MEGESTROL ACETATE 40 MG PO TABS: 40.0000 mg | ORAL_TABLET | Freq: Two times a day (BID) | ORAL | 0 refills | Status: DC

## 2017-11-26 NOTE — Progress Notes (Signed)
ADDENDUM: MILD CLEAR FREE FLUID CUL-DE-SAC  GYNECOLOGY  VISIT   HPI: 42 y.o.   Divorced  Caucasian  female   [phone] with Patient's last menstrual period was 10/29/2017.   here for   Pelvic ultrasound and endometrial biopsy.  Menorrhagia with irregular menses.  Took course of Provera 10 mg x 10 days and no improvement in her bleeding.  Hgb 13.2 on 11/17/17.  Desires hysterectomy and treatment of urinary incontinence and bladder prolapse.   GYNECOLOGIC HISTORY: Patient's last menstrual period was 10/29/2017. Contraception:  BTL  Menopausal hormone therapy:  provera Last mammogram:  09-01-17 Bilateral Diagnostic MMG/US- BIRADS 2 benign/density C Last pap smear:   08-12-13 negative, HR HPV negative         OB History    Gravida  4   Para  3   Term  0   Preterm  0   AB  1   Living  3     SAB  1   TAB  0   Ectopic  0   Multiple  0   Live Births  0              Patient Active Problem List   Diagnosis Date Noted  . Anxiety as acute reaction to exceptional stress 02/05/2015  . Mixed incontinence urge and stress 09/01/2013  . Menorrhagia 09/01/2013  . Dyspareunia 09/01/2013  . Dysmenorrhea 09/01/2013  . Breast nodule 08/12/2013  . Menorrhagia 08/12/2013  . Dysmenorrhea 08/12/2013  . UI (urinary incontinence) 08/12/2013  . Back pain 01/06/2011  . Neck pain 01/06/2011  . NICOTINE ADDICTION 12/28/2008  . FATIGUE 12/28/2008  . UNSPECIFIED URINARY INCONTINENCE 12/28/2008    Past Medical History:  Diagnosis Date  . Anxiety   . Breast nodule 08/12/2013  . Depression   . Dysmenorrhea 08/12/2013  . Menorrhagia 08/12/2013  . UI (urinary incontinence) 08/12/2013    Past Surgical History:  Procedure Laterality Date  . APPENDECTOMY    . BREAST CYST EXCISION    . CESAREAN SECTION    . OOPHORECTOMY Right   . TUBAL LIGATION      Current Outpatient Medications  Medication Sig Dispense Refill  . clonazePAM (KLONOPIN) 0.5 MG tablet 1/2-1 po BID prn anxiety  40 tablet 0  . medroxyPROGESTERone (PROVERA) 10 MG tablet Take 1 tablet (10 mg total) by mouth daily. 10 tablet 0   No current facility-administered medications for this visit.      ALLERGIES: Zoloft [sertraline hcl]  Family History  Problem Relation Age of Onset  . Dementia Father   . Bipolar disorder Sister   . Cancer Maternal Grandmother        breast  . Stroke Maternal Grandfather   . Alzheimer's disease Paternal Grandmother   . Heart attack Paternal Grandfather     Social History   Socioeconomic History  . Marital status: Divorced    Spouse name: Not on file  . Number of children: Not on file  . Years of education: Not on file  . Highest education level: Not on file  Occupational History  . Not on file  Social Needs  . Financial resource strain: Not on file  . Food insecurity:    Worry: Not on file    Inability: Not on file  . Transportation needs:    Medical: Not on file    Non-medical: Not on file  Tobacco Use  . Smoking status: Current Every Day Smoker    Packs/day: 1.00  . Smokeless tobacco:  Never Used  Substance and Sexual Activity  . Alcohol use: Yes    Comment: occasionally  . Drug use: Never  . Sexual activity: Yes    Birth control/protection: None, Surgical  Lifestyle  . Physical activity:    Days per week: Not on file    Minutes per session: Not on file  . Stress: Not on file  Relationships  . Social connections:    Talks on phone: Not on file    Gets together: Not on file    Attends religious service: Not on file    Active member of club or organization: Not on file    Attends meetings of clubs or organizations: Not on file    Relationship status: Not on file  . Intimate partner violence:    Fear of current or ex partner: Not on file    Emotionally abused: Not on file    Physically abused: Not on file    Forced sexual activity: Not on file  Other Topics Concern  . Not on file  Social History Narrative  . Not on file    Review of  Systems  Constitutional: Negative.   HENT: Negative.   Eyes: Negative.   Respiratory: Negative.   Cardiovascular: Negative.   Gastrointestinal: Positive for diarrhea.  Endocrine: Negative.   Genitourinary: Positive for frequency and vaginal bleeding.       Loss of urine with cough or sneeze  Musculoskeletal: Negative.   Skin:       Hair loss  Allergic/Immunologic: Negative.   Neurological: Negative.   Hematological: Negative.   Psychiatric/Behavioral: Negative.     PHYSICAL EXAMINATION:    BP 102/66 (BP Location: Right Arm, Patient Position: Sitting, Cuff Size: Normal)   Pulse 76   Resp 14   Ht 5' 3.75" (1.619 m)   Wt 128 lb (58.1 kg)   LMP 10/29/2017   BMI 22.14 kg/m     General appearance: alert, cooperative and appears stated age   Pelvic: External genitalia:  no lesions              Urethra:  normal appearing urethra with no masses, tenderness or lesions              Bartholins and Skenes: normal                 Vagina: normal appearing vagina with normal color and discharge, no lesions              Cervix: no lesions                Bimanual Exam:  Uterus:  normal size, contour, position, consistency, mobility, non-tender              Adnexa: no mass, fullness, tenderness           Pelvic US Uterus with multiple fibroids, largest 33 mm.  EMS 2.12 mm. Right ovary absent.  Left ovary with 68 x 36 x 22 nn complex ovarian cyst.  Septations 1.5 mm.  Avascular.  No free fluid.  EMB -  Consent for procedure.  Sterile prep with Hibiclens. Paracervical block with 10 cc 1% lidocaine, lot [id number], exp 01/23. Tenaculum to anterior cervical lip.  Pipelle passed to 7 cm x 2.  Tissue to pathology.  Minimal EBL. No complications.   Chaperone was present for exam.  ASSESSMENT  Status post BTL.  Menorrhagia with irregular menses.  Fibroids.  Complex left ovarian cyst.  Urinary incontinence.  Cystocele.  PLAN  Megace 40 mg po bid.  FU EMB Return for urodynamic  testing and pap.  Will proceed forward with planning for laparoscopic hysterectomy/LSO/pelvic washings and expected cystocele repair and possible midurethral sling.  Patient has ACOG HO on hysterectomy and on incontinence surgery.  An After Visit Summary was printed and given to the patient.  _25_____ minutes face to face time of which over 50% was spent in counseling.

## 2017-11-26 NOTE — Patient Instructions (Addendum)
Urodynamic Testing What is urodynamic testing? Urodynamic tests are done to determine how well your lower urinary tract is working. The lower urinary tract includes your bladder and the tube that empties your bladder (urethra). When your kidneys filter your blood, urine is stored in your bladder until you feel the urge to pass urine (urinate). Urination requires coordination between the nerves and muscles of your bladder and urethra. When your lower urinary tract is working well, you should be able to:  Start urinating when your bladder is full.  Empty your bladder completely.  Control the flow of your urine.  Why do I need urodynamic testing? You may need urodynamic testing if you:  Are leaking urine (incontinence).  Have problems starting or stopping your urine flow.  Have frequent or painful urination.  Have frequent urinary tract infections.  Cannot empty your bladder completely.  Have strong urges to pass urine (urgency).  Have a weak flow of urine.  How is urodynamic testing done? Urodynamic tests may be done separately or all during one testing visit. These tests may be done at your health care provider's office, a clinic, or a hospital. You may be given an antibiotic medicine before or after testing to prevent infection. Ask your health care provider if you should:  Stop taking any of your regular medicines.  Arrive for the test with a full bladder.  The urodynamic tests you may have done include: Uroflowmetry This test measures how much urine you pass and how long it takes to pass.  You sit on a special toilet to urinate.  The toilet measures the volume and the time of your urine flow.  These measurements are sent to a computer that creates a graph of your urine flow.  Postvoid residual measurement This test measures how much urine is left in your bladder after you urinate.  It uses sound waves (ultrasound) to create an image of your bladder.  The test can  also be done by inserting a thin, flexible tube (catheter) into your bladder after you urinate.  Remaining urine is measured in milliliters (mL). If you have more than 100 mL left in your bladder after you urinate, your bladder is not emptying as it should.  Cystometric testing This test uses a special bladder catheter that can measure pressure.  A numbing medicine (local anesthetic) may be used.  First, a normal catheter is used to empty your bladder completely.  Then the measuring catheter is placed, and your bladder is filled with warm, germ-free (sterile) water.  Pressure measurements will be taken: ? As your bladder fills. ? When you feel the need to urinate. ? As your bladder is emptied.  You may be asked to cough or bear down to check for leakage.  In some cases, your bladder may be filled with a material that shows up on X-rays (contrast material) so that X-ray pictures can be taken during the test.  Electromyography This test measures the electrical activity of the nerves and muscles of your bladder and the opening of your urethra.  It tells how well your nerves are communicating with your muscles.  Sticky patches are placed near your rectum and urethra to measure electrical activity.  What are the risks of this testing? Generally, these tests are safe. However, problems can occur and include:  Discomfort.  Frequent urge to urinate.  Bleeding.  Infection.  An allergic reaction to contrast material, if contrast material is used.  What happens after the testing?  You should  be able to go home right away and do your usual activities.  You may be instructed to drink a tall glass of water every 30 minutes for the first 2 hours you are home.  Taking a warm bath or using a warm compress may relieve any discomfort near your urethra. Let your health care provider know if you have:  Pain.  Blood in your urine.  Chills.  Fever.  What do my results  mean? Discuss the results of your urodynamic tests with your health care provider. Your health care provider will use the results of these and other tests, along with your signs and symptoms, to make a diagnosis. Some common causes for abnormal results from urodynamic tests include:  Enlarged prostate in men.  Overactive bladder.  Urinary tract infection.  Nervous system diseases.  Spinal cord damage.  This information is not intended to replace advice given to you by your health care provider. Make sure you discuss any questions you have with your health care provider. Document Released: 01/05/2007 Document Revised: 02/05/2016 Document Reviewed: 06/20/2013 Elsevier Interactive Patient Education  2017 [place].     Endometrial Biopsy, Care After This sheet gives you information about how to care for yourself after your procedure. Your health care provider may also give you more specific instructions. If you have problems or questions, contact your health care provider. What can I expect after the procedure? After the procedure, it is common to have:  Mild cramping.  A small amount of vaginal bleeding for a few days. This is normal.  Follow these instructions at home:  Take over-the-counter and prescription medicines only as told by your health care provider.  Do not douche, use tampons, or have sexual intercourse until your health care provider approves.  Return to your normal activities as told by your health care provider. Ask your health care provider what activities are safe for you.  Follow instructions from your health care provider about any activity restrictions, such as restrictions on strenuous exercise or heavy lifting. Contact a health care provider if:  You have heavy bleeding, or bleed for longer than 2 days after the procedure.  You have bad smelling discharge from your vagina.  You have a fever or chills.  You have a burning sensation when urinating  or you have difficulty urinating.  You have severe pain in your lower abdomen. Get help right away if:  You have severe cramps in your stomach or back.  You pass large blood clots.  Your bleeding increases.  You become weak or light-headed, or you pass out. Summary  After the procedure, it is common to have mild cramping and a small amount of vaginal bleeding for a few days.  Do not douche, use tampons, or have sexual intercourse until your health care provider approves.  Return to your normal activities as told by your health care provider. Ask your health care provider what activities are safe for you. This information is not intended to replace advice given to you by your health care provider. Make sure you discuss any questions you have with your health care provider. Document Released: 12/29/2012 Document Revised: 03/26/2016 Document Reviewed: 03/26/2016 Elsevier Interactive Patient Education  2017 [place].  Ovarian Cyst An ovarian cyst is a fluid-filled sac that forms on an ovary. The ovaries are small organs that produce eggs in women. Various types of cysts can form on the ovaries. Some may cause symptoms and require treatment. Most ovarian cysts go away on their  own, are not cancerous (are benign), and do not cause problems. Common types of ovarian cysts include:  Functional (follicle) cysts. ? Occur during the menstrual cycle, and usually go away with the next menstrual cycle if you do not get pregnant. ? Usually cause no symptoms.  Endometriomas. ? Are cysts that form from the tissue that lines the uterus (endometrium). ? Are sometimes called "chocolate cysts" because they become filled with blood that turns brown. ? Can cause pain in the lower abdomen during intercourse and during your period.  Cystadenoma cysts. ? Develop from cells on the outside surface of the ovary. ? Can get very large and cause lower abdomen pain and pain with intercourse. ? Can cause  severe pain if they twist or break open (rupture).  Dermoid cysts. ? Are sometimes found in both ovaries. ? May contain different kinds of body tissue, such as skin, teeth, hair, or cartilage. ? Usually do not cause symptoms unless they get very big.  Theca lutein cysts. ? Occur when too much of a certain hormone (human chorionic gonadotropin) is produced and overstimulates the ovaries to produce an egg. ? Are most common after having procedures used to assist with the conception of a baby (in vitro fertilization).  What are the causes? Ovarian cysts may be caused by:  Ovarian hyperstimulation syndrome. This is a condition that can develop from taking fertility medicines. It causes multiple large ovarian cysts to form.  Polycystic ovarian syndrome (PCOS). This is a common hormonal disorder that can cause ovarian cysts, as well as problems with your period or fertility.  What increases the risk? The following factors may make you more likely to develop ovarian cysts:  Being overweight or obese.  Taking fertility medicines.  Taking certain forms of hormonal birth control.  Smoking.  What are the signs or symptoms? Many ovarian cysts do not cause symptoms. If symptoms are present, they may include:  Pelvic pain or pressure.  Pain in the lower abdomen.  Pain during sex.  Abdominal swelling.  Abnormal menstrual periods.  Increasing pain with menstrual periods.  How is this diagnosed? These cysts are commonly found during a routine pelvic exam. You may have tests to find out more about the cyst, such as:  Ultrasound.  X-ray of the pelvis.  CT scan.  MRI.  Blood tests.  How is this treated? Many ovarian cysts go away on their own without treatment. Your health care provider may want to check your cyst regularly for 2-3 months to see if it changes. If you are in menopause, it is especially important to have your cyst monitored closely because menopausal women have a  higher rate of ovarian cancer. When treatment is needed, it may include:  Medicines to help relieve pain.  A procedure to drain the cyst (aspiration).  Surgery to remove the whole cyst.  Hormone treatment or birth control pills. These methods are sometimes used to help dissolve a cyst.  Follow these instructions at home:  Take over-the-counter and prescription medicines only as told by your health care provider.  Do not drive or use heavy machinery while taking prescription pain medicine.  Get regular pelvic exams and Pap tests as often as told by your health care provider.  Return to your normal activities as told by your health care provider. Ask your health care provider what activities are safe for you.  Do not use any products that contain nicotine or tobacco, such as cigarettes and e-cigarettes. If you need help quitting,  ask your health care provider.  Keep all follow-up visits as told by your health care provider. This is important. Contact a health care provider if:  Your periods are late, irregular, or painful, or they stop.  You have pelvic pain that does not go away.  You have pressure on your bladder or trouble emptying your bladder completely.  You have pain during sex.  You have any of the following in your abdomen: ? A feeling of fullness. ? Pressure. ? Discomfort. ? Pain that does not go away. ? Swelling.  You feel generally ill.  You become constipated.  You lose your appetite.  You develop severe acne.  You start to have more body hair and facial hair.  You are gaining weight or losing weight without changing your exercise and eating habits.  You think you may be pregnant. Get help right away if:  You have abdominal pain that is severe or gets worse.  You cannot eat or drink without vomiting.  You suddenly develop a fever.  Your menstrual period is much heavier than usual. This information is not intended to replace advice given to you  by your health care provider. Make sure you discuss any questions you have with your health care provider. Document Released: 03/10/2005 Document Revised: 09/28/2015 Document Reviewed: 08/12/2015 Elsevier Interactive Patient Education  [employer].

## 2017-12-03 ENCOUNTER — Telehealth: Payer: Self-pay | Admitting: Obstetrics and Gynecology

## 2017-12-03 NOTE — Telephone Encounter (Signed)
Spoke with patient regarding benefit for urodynamic testing. Patient understood and agreeable. Patient ready to schedule. Patient scheduled for urodynamic testing on 12/09/17. Patient aware she will be contacted by our nurse for pre-appointment instructions. Patient is aware of appointment date, arrival time and cancellation policy.   Forwarding to Lamont Snowball, RN  cc: Karen Chafe, RN

## 2017-12-03 NOTE — Telephone Encounter (Signed)
Called patient, she needs a lab appointment for urine check on Friday 12/04/17. Requests call back after 1:30 today.

## 2017-12-03 NOTE — Telephone Encounter (Signed)
Spoke with patient and urodynamics instructions given. Scheduled urodynamics procedure for 12/09/17.  Advised to stop all bladder medications one week prior to procedure, patient states she is not on any medications. Arrive with comfortably full bladder.  Advised will need pre procedure UA to check for any infection prior. Scheduled for 12/04/17.  Scheduled follow up with Dr. Quincy Simmonds for 12/16/17.  Brief description of procedure given.  Patient verbalizes understanding of instructions and agreeable to appointments as scheduled.  Will close encounter.

## 2017-12-04 ENCOUNTER — Ambulatory Visit (INDEPENDENT_AMBULATORY_CARE_PROVIDER_SITE_OTHER): Payer: 59

## 2017-12-04 VITALS — BP 110/72 | HR 72 | Resp 14 | Ht 63.75 in | Wt 126.0 lb

## 2017-12-04 DIAGNOSIS — N8110 Cystocele, unspecified: Secondary | ICD-10-CM

## 2017-12-04 LAB — POCT URINALYSIS DIPSTICK
BILIRUBIN UA: NEGATIVE
Blood, UA: NEGATIVE
Glucose, UA: NEGATIVE
KETONES UA: NEGATIVE
Leukocytes, UA: NEGATIVE
NITRITE UA: NEGATIVE
PH UA: 5 (ref 5.0–8.0)
Protein, UA: NEGATIVE
UROBILINOGEN UA: NEGATIVE U/dL — AB

## 2017-12-04 NOTE — Progress Notes (Signed)
Patient is here for pre procedure UA ahead of urodynamics 12/09/17. Patient denies any UTI symptoms at this time.   Result routed to provider for review.

## 2017-12-09 ENCOUNTER — Ambulatory Visit (INDEPENDENT_AMBULATORY_CARE_PROVIDER_SITE_OTHER): Payer: 59 | Admitting: Obstetrics and Gynecology

## 2017-12-09 DIAGNOSIS — N393 Stress incontinence (female) (male): Secondary | ICD-10-CM | POA: Diagnosis not present

## 2017-12-09 NOTE — Patient Instructions (Signed)
After your procedure:   You may have a mild bladder and rectal discomfort for a few hours after the test. . You may experience some frequent urination and slight burning the first few times you urinate after the test. Rarely, the urine may be blood tinged. These are both due to catheter placements and resolve quickly.  . You should call our office immediately if you have signs of infection, which may include bladder pain, urinary urgency, fever, or burning during urination. .  We do encourage you to drink plenty of water after completion of the test today.   Please call our office with any concerns or questions.   [facility] [address], [address] [place], [place] [address] PH:  [phone]; Fax:  [phone] 

## 2017-12-09 NOTE — Progress Notes (Signed)
Anna Gonzalez is a 42 y.o. female Who presents today for urodynamics testing, ordered by Dr. Quincy Simmonds.   Allergies and medications reviewed.  Denies complaints today. No urinary complaints.   Urine Micro exam: negative for WBC's or RBC's, okay to proceed per Dr. Quincy Simmonds.  Patient reports urinary leakage with coughing, sneezing, exercise.   Urodynamics testing initiated. Lumax Bladder Catheter #10 [place] and lumax Abdominal Catheter #10 [place].   Post void residual 40 ml.   Urethral catheter placed without issue. Rectal catheter placed without issue.   Urodynamics testing completed. Please see scanned Patient summary report in Epic. Procedure completed and patient tolerated well without complaints. Patient scheduled for follow up office visit with Dr. Quincy Simmonds to discuss results. Patient agreeable.   Patient given post procedure instructions:  You may have a mild bladder and rectal discomfort for a few hours after the test. You may experience some frequent urination and slight burning the first few times you urinate after the test. Rarely, the urine may be blood tinged. These are both due to catheter placements and resolve quickly. You should call our office immediately if you have signs of infection, which may include bladder pain, urinary urgency, fever, or burning during urination. We do encourage you to drink plenty of water after the test.

## 2017-12-16 ENCOUNTER — Ambulatory Visit (INDEPENDENT_AMBULATORY_CARE_PROVIDER_SITE_OTHER): Payer: 59 | Admitting: Obstetrics and Gynecology

## 2017-12-16 ENCOUNTER — Other Ambulatory Visit: Payer: Self-pay

## 2017-12-16 ENCOUNTER — Encounter: Payer: Self-pay | Admitting: Obstetrics and Gynecology

## 2017-12-16 ENCOUNTER — Other Ambulatory Visit (HOSPITAL_COMMUNITY)
Admission: RE | Admit: 2017-12-16 | Discharge: 2017-12-16 | Disposition: A | Discharge status: Home / Self Care | Payer: 59 | Source: Ambulatory Visit | Attending: Obstetrics and Gynecology | Admitting: Obstetrics and Gynecology

## 2017-12-16 VITALS — BP 112/86 | HR 72 | Resp 16 | Ht 63.75 in | Wt 128.0 lb

## 2017-12-16 DIAGNOSIS — D219 Benign neoplasm of connective and other soft tissue, unspecified: Secondary | ICD-10-CM

## 2017-12-16 DIAGNOSIS — N393 Stress incontinence (female) (male): Secondary | ICD-10-CM

## 2017-12-16 DIAGNOSIS — N83202 Unspecified ovarian cyst, left side: Secondary | ICD-10-CM

## 2017-12-16 DIAGNOSIS — N8110 Cystocele, unspecified: Secondary | ICD-10-CM | POA: Diagnosis not present

## 2017-12-16 DIAGNOSIS — Z01419 Encounter for gynecological examination (general) (routine) without abnormal findings: Secondary | ICD-10-CM | POA: Diagnosis not present

## 2017-12-16 DIAGNOSIS — Z124 Encounter for screening for malignant neoplasm of cervix: Secondary | ICD-10-CM

## 2017-12-16 NOTE — Progress Notes (Signed)
GYNECOLOGY  VISIT   HPI: 42 y.o.   Divorce  female   [phone] with Patient's last menstrual period was 10/29/2017 (exact date).   here for urodynamics results and pap.  Patient is desirous of hysterectomy for menorrhagia and surgical care for urinary incontinence.   Has uterine fibroids and left ovarian cyst consistent with possible cystadenoma. Bleeding has finally with Megace.  Stopped the day after she started this medication.   Leaks with moving fast and walking, coughing, sneezing, laughing.   Multichannel urodynamic testing - 12/09/17 Uroflow - Continuous.  Void - 492 cc, PVR 45 cc. CMG - S1 265 cc, S3 (Max capacity) - 602 cc.  VLPP 75 cm H2O at 594 cc.  Stable CMG. UPP - 23 cm H2O at 605 cc.  Pressure flow - P Det max 36 cm H2O.  Voided 145 cc. Not continuous  Valsalva with voiding. Stress incontinence confirmed. Did not void full volume with pressure flow.  Had normal uroflow.   Considering November for surgery.   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   GYNECOLOGIC HISTORY: Patient's last menstrual period was 10/29/2017 (exact date). Contraception:  BTL Menopausal hormone therapy: none Last mammogram:  09/01/17 Birads 2/ density C  Last pap smear:   08/12/13 Neg Hr HPV Negative         OB History    Gravida  4   Para  3   Term  0   Preterm  0   AB  1   Living  3     SAB  1   TAB  0   Ectopic  0   Multiple  0   Live Births  0              Patient Active Problem List   Diagnosis Date Noted  . Anxiety as acute reaction to exceptional stress 02/05/2015  . Mixed incontinence urge and stress 09/01/2013  . Menorrhagia 09/01/2013  . Dyspareunia 09/01/2013  . Dysmenorrhea 09/01/2013  . Breast nodule 08/12/2013  . Menorrhagia 08/12/2013  . Dysmenorrhea 08/12/2013  . UI (urinary incontinence) 08/12/2013  . Back pain 01/06/2011  . Neck pain 01/06/2011  . NICOTINE ADDICTION 12/28/2008  . FATIGUE 12/28/2008  . UNSPECIFIED URINARY INCONTINENCE 12/28/2008     Past Medical History:  Diagnosis Date  . Anxiety   . Breast nodule 08/12/2013  . Depression   . Dysmenorrhea 08/12/2013  . Fibroid   . Menorrhagia 08/12/2013  . UI (urinary incontinence) 08/12/2013    Past Surgical History:  Procedure Laterality Date  . APPENDECTOMY    . BREAST CYST EXCISION    . CESAREAN SECTION    . OOPHORECTOMY Right   . TUBAL LIGATION      Current Outpatient Medications  Medication Sig Dispense Refill  . clonazePAM (KLONOPIN) 0.5 MG tablet 1/2-1 po BID prn anxiety 40 tablet 0  . megestrol (MEGACE) 40 MG tablet Take 1 tablet (40 mg total) by mouth 2 (two) times daily. 60 tablet 0   No current facility-administered medications for this visit.      ALLERGIES: Zoloft [sertraline hcl]  Family History  Problem Relation Age of Onset  . Dementia Father   . Bipolar disorder Sister   . Cancer Maternal Grandmother        breast  . Stroke Maternal Grandfather   . Alzheimer's disease Paternal Grandmother   . Heart attack Paternal Grandfather     Social History   Socioeconomic History  . Marital status: Divorced  Spouse name: Not on file  . Number of children: Not on file  . Years of education: Not on file  . Highest education level: Not on file  Occupational History  . Not on file  Social Needs  . Financial resource strain: Not on file  . Food insecurity:    Worry: Not on file    Inability: Not on file  . Transportation needs:    Medical: Not on file    Non-medical: Not on file  Tobacco Use  . Smoking status: Current Every Day Smoker    Packs/day: 1.00  . Smokeless tobacco: Never Used  Substance and Sexual Activity  . Alcohol use: Yes    Comment: occasionally  . Drug use: Never  . Sexual activity: Yes    Birth control/protection: None, Surgical  Lifestyle  . Physical activity:    Days per week: Not on file    Minutes per session: Not on file  . Stress: Not on file  Relationships  . Social connections:    Talks on phone: Not on  file    Gets together: Not on file    Attends religious service: Not on file    Active member of club or organization: Not on file    Attends meetings of clubs or organizations: Not on file    Relationship status: Not on file  . Intimate partner violence:    Fear of current or ex partner: Not on file    Emotionally abused: Not on file    Physically abused: Not on file    Forced sexual activity: Not on file  Other Topics Concern  . Not on file  Social History Narrative  . Not on file    Review of Systems  All other systems reviewed and are negative.   PHYSICAL EXAMINATION:    BP 112/86 (BP Location: Right Arm, Patient Position: Sitting, Cuff Size: Normal)   Pulse 72   Resp 16   Ht 5' 3.75" (1.619 m)   Wt 128 lb (58.1 kg)   LMP 10/29/2017 (Exact Date)   BMI 22.14 kg/m     General appearance: alert, cooperative and appears stated age   Pelvic: External genitalia:  no lesions              Urethra:  normal appearing urethra with no masses, tenderness or lesions              Bartholins and Skenes: normal                 Vagina: normal appearing vagina with normal color and discharge, no lesions.  First degree cystocele.               Cervix: no lesions.  Pap taken.                 Bimanual Exam:  Uterus: 12 week size.               Adnexa: no mass, fullness, tenderness              Chaperone was present for exam.  ASSESSMENT  Stress incontinence.  First degree cystocele. Uterine fibroids.  Complex left ovarian cyst.  Hx Cesarean section, right oophorectomy, tubal ligation, and appendectomy.   PLAN  We reviewed her urodynamic testing and dx of stress incontinence.  Discussed observational management, physical therapy, Impressa use, pessary use, and surgical treatment with anterior colporrhapahy, midurethral sling and cystoscopy.  We discussed permanent mesh materials which are currently  the most widely used for a urethral sling procedure.  I discussed slings being  85 - 90% effective in treatment of stress incontinence.  I discussed risks of slings including but not limited to erosions and exposure, dyspareunia, cystotomy, urinary retention and slower voiding, increase in urgency symptoms, need for prolonged catheterization or self catheterization, urinary tract infections, need for reoperation, and recurrence of prolapse and incontinence.   We did review laparoscopic hysterectomy with bilateral salpingectomy, and left oophorectomy as well.  Limitations of CA125 in pre and postmenopausal women discussed.  I would recommend HRT after surgery.  OK to continue Megace until surgery.  Wants 4 weeks off from work.    An After Visit Summary was printed and given to the patient.  __25____ minutes face to face time of which over 50% was spent in counseling.

## 2017-12-19 ENCOUNTER — Encounter: Payer: Self-pay | Admitting: Obstetrics and Gynecology

## 2017-12-19 DIAGNOSIS — N393 Stress incontinence (female) (male): Secondary | ICD-10-CM | POA: Insufficient documentation

## 2017-12-19 DIAGNOSIS — N8110 Cystocele, unspecified: Secondary | ICD-10-CM | POA: Insufficient documentation

## 2017-12-19 DIAGNOSIS — N83202 Unspecified ovarian cyst, left side: Secondary | ICD-10-CM | POA: Insufficient documentation

## 2017-12-22 LAB — CYTOLOGY - PAP
Diagnosis: UNDETERMINED — AB
HPV (WINDOPATH): NOT DETECTED

## 2018-01-01 ENCOUNTER — Other Ambulatory Visit (INDEPENDENT_AMBULATORY_CARE_PROVIDER_SITE_OTHER): Payer: 59

## 2018-01-01 ENCOUNTER — Other Ambulatory Visit: Payer: Self-pay | Admitting: Obstetrics and Gynecology

## 2018-01-01 DIAGNOSIS — N83202 Unspecified ovarian cyst, left side: Secondary | ICD-10-CM

## 2018-01-02 LAB — CA 125: Cancer Antigen (CA) 125: 40 U/mL — ABNORMAL HIGH (ref 0.0–38.1)

## 2018-01-05 ENCOUNTER — Other Ambulatory Visit: Payer: Self-pay | Admitting: Obstetrics and Gynecology

## 2018-01-05 ENCOUNTER — Telehealth: Payer: Self-pay | Admitting: Obstetrics and Gynecology

## 2018-01-05 DIAGNOSIS — N83299 Other ovarian cyst, unspecified side: Secondary | ICD-10-CM

## 2018-01-05 NOTE — Telephone Encounter (Signed)
Call placed to patient to review benefits for surgery, as previously discussed prior to urodynamic testing. Patient understood information presented. Patient aware this is professional benefit only. Patient aware will be contacted by hospital for separate benefits. Patient advises, she would like to defer scheduling until January 2020.   cc: Lamont Snowball, RN

## 2018-01-05 NOTE — Telephone Encounter (Signed)
See previous phone note. After completing call for surgery benefits, patient requested a refill for megestrol.   Forwarding to Triage Nurse for review

## 2018-01-05 NOTE — Telephone Encounter (Signed)
Patient requested to defer surgery until 03/2018. Requests refills of Megace.   Please advise.

## 2018-01-07 MED ORDER — MEGESTROL ACETATE 40 MG PO TABS: 40.0000 mg | ORAL_TABLET | Freq: Two times a day (BID) | ORAL | 1 refills | Status: DC

## 2018-01-07 NOTE — Telephone Encounter (Signed)
Follow-up call to patient regarding plan to delay surgery.  States she hopes to proceed with surgery soon but unsure right now. Bleeding has been fine on Megace but has run out and been off for two days. Had tapered down to daily in effort to prolong medication. Has started with cramping but no bleeding yet. Advised will review medication refill as well as follow-up of ovarian cyst with Dr. Quincy Simmonds until determines plan for surgery.  Phone report to Dr Quincy Simmonds. She has actually refilled medication. ( see alternate encounter.)  Per Dr Quincy Simmonds, patient should return to taking Megace BID and have follow-up in 6 weeks for CA 125 and pelvic ultrasound.  Call to patient and advised of recommendations from Dr Quincy Simmonds. Follow-up appointment scheduled for 02-11-18.  Encounter closed.

## 2018-01-07 NOTE — Telephone Encounter (Signed)
Patient notified refills sent by Dr Quincy Simmonds. See alternate phone encounter for further details regarding call.

## 2018-01-09 ENCOUNTER — Encounter: Payer: Self-pay | Admitting: Obstetrics and Gynecology

## 2018-01-09 DIAGNOSIS — N949 Unspecified condition associated with female genital organs and menstrual cycle: Secondary | ICD-10-CM | POA: Insufficient documentation

## 2018-01-18 ENCOUNTER — Telehealth: Payer: Self-pay | Admitting: Obstetrics and Gynecology

## 2018-01-18 ENCOUNTER — Encounter: Payer: Self-pay | Admitting: Obstetrics and Gynecology

## 2018-01-18 NOTE — Telephone Encounter (Signed)
Spoke with patient.  She states she has been having increased episodes of crying and anxiety. Has had suicidal thoughts, but no intent, no plan. No active thoughts to harm herself or anyone else. She states she feels safe at this time with no suicidal thoughts. States she feels "blah" and "overwhelmed." She states she would not hurt herself, only had thoughts. Feels it is related to Megace.   Has used Klonopin today and feels improved. She has some from her PCP and uses only PRN per patient. Today, has had increased anxiety. Is not on a daily antidepressant. She states when she has tried to use Zoloft or Prozac, she felt "off" and "blah" or "like a zombie."   Office visit scheduled with Dr. Quincy Simmonds for tomorrow morning. She denies any vaginal bleeding.  She is advised to call 911 or seek emergency care if develops any feelings of thoughts of harm to herself or anyone else. Patients agrees to this plan and verbalizes that she is safe.   Routing to Dr. Quincy Simmonds and will close encounter as patient has appointment tomorrow at 0830.

## 2018-01-18 NOTE — Telephone Encounter (Signed)
Patient sent the following correspondence through [facility]. Routing to triage to assist patient with request.  Hello   Just wanted to know if Megace will cause depression. I have been feeling very depressed lately and if so is there anything else I can take besides the megace    Thank you   Clinton Quant

## 2018-01-18 NOTE — Telephone Encounter (Signed)
Megace can cause depression in 1 - 3% of patients.  I agree with on office visit tomorrow. I agree with your recommendations for emergency care if needed.

## 2018-01-19 ENCOUNTER — Encounter: Payer: Self-pay | Admitting: Obstetrics and Gynecology

## 2018-01-19 ENCOUNTER — Other Ambulatory Visit: Payer: Self-pay

## 2018-01-19 ENCOUNTER — Ambulatory Visit: Payer: 59 | Admitting: Obstetrics and Gynecology

## 2018-01-19 VITALS — BP 110/70 | HR 88 | Resp 16 | Ht 63.75 in | Wt 133.0 lb

## 2018-01-19 DIAGNOSIS — F32A Depression, unspecified: Secondary | ICD-10-CM

## 2018-01-19 DIAGNOSIS — N83202 Unspecified ovarian cyst, left side: Secondary | ICD-10-CM | POA: Diagnosis not present

## 2018-01-19 DIAGNOSIS — R300 Dysuria: Secondary | ICD-10-CM

## 2018-01-19 DIAGNOSIS — F329 Major depressive disorder, single episode, unspecified: Secondary | ICD-10-CM | POA: Diagnosis not present

## 2018-01-19 LAB — POCT URINALYSIS DIPSTICK
Bilirubin, UA: NEGATIVE
Blood, UA: NEGATIVE
Glucose, UA: NEGATIVE
KETONES UA: NEGATIVE
Leukocytes, UA: NEGATIVE
Nitrite, UA: NEGATIVE
PROTEIN UA: NEGATIVE
Urobilinogen, UA: 0.2 E.U./dL
pH, UA: 7 (ref 5.0–8.0)

## 2018-01-19 NOTE — Telephone Encounter (Signed)
Noted message from Dr. Silva.  Will close encounter.   

## 2018-01-19 NOTE — Progress Notes (Signed)
Scheduled for appointment with Anna Kirschner, MD at [facility] [phone] 01/20/18 at 3:30 and Pelvic ultrasound  Rescheduled per Dr. Quincy Simmonds to 01/21/18 at 0900.

## 2018-01-19 NOTE — Progress Notes (Signed)
GYNECOLOGY  VISIT   HPI: 42 y.o.   Divorced  Caucasian  female   [phone] with Patient's last menstrual period was 10/29/2017 (exact date).   here for depression and panic attacks. Also reporting dysuria.   Hx of theses depression and panic for years.  Usually has an increase in symptoms with change to winter season.  Has tried multiple meds like Zoloft.  The only med that works is Clonazepam.  PCP has been treating her.   Has thoughts of suicide but would not act.  Worried about upcoming surgery and also her father who has Alzheimer's. Caring for her 60 year old nephew.   Concerned about Megace and side effects of depression and suicidal ideation.   Taking Megace to control menses, and it is working.  She has no bleeding with this.  Did a course of Provera and had no improvement of her bleeding.   Planning hysterectomy and left oophorectomy with pelvic washings, anterior colporrhaphy, TVT Exact midurethral sling in Jan. 2020 for symptomatic fibroids, complex left ovarian cyst, cystocele and genuine stress incontinence.  Her uterus is 12 week size.  EMB benign.  CA125 is 40 on 01/01/18. Hgb 13.2 on 11/17/17. TSH 0.741 on 11/19/17.   GYNECOLOGIC HISTORY: Patient's last menstrual period was 10/29/2017 (exact date). Contraception:  BTL Menopausal hormone therapy:  none Last mammogram:  09/01/17 [place] Right BIRADS2:benign  Last pap smear:   12/16/17 ASCUS. HR HPV:neg         OB History    Gravida  4   Para  3   Term  0   Preterm  0   AB  1   Living  3     SAB  1   TAB  0   Ectopic  0   Multiple  0   Live Births  0              Patient Active Problem List   Diagnosis Date Noted  . Ovarian cyst   . Female bladder prolapse 12/19/2017  . Genuine stress incontinence, female 12/19/2017  . Left ovarian cyst 12/19/2017  . Anxiety as acute reaction to exceptional stress 02/05/2015  . Mixed incontinence urge and stress 09/01/2013  . Menorrhagia 09/01/2013  .  Dyspareunia 09/01/2013  . Dysmenorrhea 09/01/2013  . Breast nodule 08/12/2013  . Menorrhagia 08/12/2013  . Dysmenorrhea 08/12/2013  . UI (urinary incontinence) 08/12/2013  . Back pain 01/06/2011  . Neck pain 01/06/2011  . NICOTINE ADDICTION 12/28/2008  . FATIGUE 12/28/2008  . UNSPECIFIED URINARY INCONTINENCE 12/28/2008    Past Medical History:  Diagnosis Date  . Anxiety   . Breast nodule 08/12/2013  . Depression   . Dysmenorrhea 08/12/2013  . Fibroid   . Menorrhagia 08/12/2013  . Ovarian cyst    left - 68 x 36 x 22 mm, complex, CA125 40  . UI (urinary incontinence) 08/12/2013    Past Surgical History:  Procedure Laterality Date  . APPENDECTOMY    . BREAST CYST EXCISION    . CESAREAN SECTION    . OOPHORECTOMY Right   . TUBAL LIGATION      Current Outpatient Medications  Medication Sig Dispense Refill  . clonazePAM (KLONOPIN) 0.5 MG tablet 1/2-1 po BID prn anxiety 40 tablet 0  . megestrol (MEGACE) 40 MG tablet Take 1 tablet (40 mg total) by mouth 2 (two) times daily. 60 tablet 1   No current facility-administered medications for this visit.      ALLERGIES: Zoloft [sertraline hcl]  Family History  Problem Relation Age of Onset  . Dementia Father   . Bipolar disorder Sister   . Cancer Maternal Grandmother        breast  . Stroke Maternal Grandfather   . Alzheimer's disease Paternal Grandmother   . Heart attack Paternal Grandfather     Social History   Socioeconomic History  . Marital status: Divorced    Spouse name: Not on file  . Number of children: Not on file  . Years of education: Not on file  . Highest education level: Not on file  Occupational History  . Not on file  Social Needs  . Financial resource strain: Not on file  . Food insecurity:    Worry: Not on file    Inability: Not on file  . Transportation needs:    Medical: Not on file    Non-medical: Not on file  Tobacco Use  . Smoking status: Current Every Day Smoker    Packs/day: 1.00  .  Smokeless tobacco: Never Used  Substance and Sexual Activity  . Alcohol use: Yes    Comment: occasionally  . Drug use: Never  . Sexual activity: Yes    Birth control/protection: None, Surgical  Lifestyle  . Physical activity:    Days per week: Not on file    Minutes per session: Not on file  . Stress: Not on file  Relationships  . Social connections:    Talks on phone: Not on file    Gets together: Not on file    Attends religious service: Not on file    Active member of club or organization: Not on file    Attends meetings of clubs or organizations: Not on file    Relationship status: Not on file  . Intimate partner violence:    Fear of current or ex partner: Not on file    Emotionally abused: Not on file    Physically abused: Not on file    Forced sexual activity: Not on file  Other Topics Concern  . Not on file  Social History Narrative  . Not on file    Review of Systems  Genitourinary: Positive for dysuria and frequency.  Skin:       Hair loss  Psychiatric/Behavioral: The patient is nervous/anxious.        Depression  Excess crying   All other systems reviewed and are negative.   PHYSICAL EXAMINATION:    BP 110/70 (BP Location: Right Arm, Patient Position: Sitting, Cuff Size: Normal)   Pulse 88   Resp 16   Ht 5' 3.75" (1.619 m)   Wt 133 lb (60.3 kg)   LMP 10/29/2017 (Exact Date)   BMI 23.01 kg/m     General appearance: alert, cooperative and appears stated age.  Tearful at times.    Chaperone was present for exam.  ASSESSMENT  Stress incontinence.  First degree cystocele. Uterine fibroids.  Complex left ovarian cyst.  Mildly elevated CA125.  Hx Cesarean section, right oophorectomy, tubal ligation, and appendectomy.  On Megace for menstrual control. Depression with suicidal ideation and panic.  Situational stress.  Smoker.  Painful urination.   PLAN  Urine dip - negative.  We discussed her anxiety and depression and need for reassessment with  her PCP for medical therapy.  We talked about resources for care of her father and nephew.  We reviewed depression from taking Megace for 1 - 3 % of patients.  She wants to continue with the Megace for now as  she recognizes she has significant stressors that are causing her to feel depressed.  She will return this week for an ultrasound recheck of her ovarian cyst.  We discussed her doing her surgery at a later date if the cyst is resolved.    An After Visit Summary was printed and given to the patient.  ___25___ minutes face to face time of which over 50% was spent in counseling.

## 2018-01-20 ENCOUNTER — Ambulatory Visit: Payer: 59 | Admitting: Family Medicine

## 2018-01-20 ENCOUNTER — Encounter: Payer: Self-pay | Admitting: Family Medicine

## 2018-01-20 DIAGNOSIS — F411 Generalized anxiety disorder: Secondary | ICD-10-CM

## 2018-01-20 DIAGNOSIS — F321 Major depressive disorder, single episode, moderate: Secondary | ICD-10-CM

## 2018-01-20 MED ORDER — BUPROPION HCL ER (XL) 150 MG PO TB24: ORAL_TABLET | ORAL | 0 refills | Status: DC

## 2018-01-20 MED ORDER — BUPROPION HCL ER (XL) 300 MG PO TB24: 300.0000 mg | ORAL_TABLET | Freq: Every day | ORAL | 5 refills | Status: DC

## 2018-01-20 MED ORDER — CLONAZEPAM 0.5 MG PO TABS: ORAL_TABLET | ORAL | 1 refills | Status: DC

## 2018-01-20 NOTE — Progress Notes (Signed)
   Subjective:    Patient ID: Anna Gonzalez, female    DOB: 08/27/1975, 42 y.o.   MRN: [id number]  HPI Patient is here today with some depression and anxiety. She states it normally happens this time of the year.  Patient presents with elements of depression and sadness.  No suicidal thoughts.  At times has what to use thoughts but states would never act upon this because other family members are too important to her.  Patient also notes anxiety.  In the past medication has definitely helped.  Fair amount of stress at work these days.  Also stress at home.  Patient not exercising as much as she had hoped  Unfortunately still smoking   Review of Systems No headache, no major weight loss or weight gain, no chest pain no back pain abdominal pain no change in bowel habits complete ROS otherwise negative     Objective:   Physical Exam  Alert and oriented, vitals reviewed and stable, NAD ENT-TM's and ext canals WNL bilat via otoscopic exam Soft palate, tonsils and post pharynx WNL via oropharyngeal exam Neck-symmetric, no masses; thyroid nonpalpable and nontender Pulmonary-no tachypnea or accessory muscle use; Clear without wheezes via auscultation Card--no abnrml murmurs, rhythm reg and rate WNL Carotid pulses symmetric, without bruits       Assessment & Plan:  Impression depression with anxiety.  Long discussion held.  Patient wonders if Megace somewhat related but wants to stay on it.  Also has history of seasonal affect of disorder tendencies.  Options discussed.  Patient would like to try something different this time.  She is also smoking.  Would like something that might help in this regard to options discussed.  Will initiate Wellbutrin rational discussed.  Exercise encouraged.  Encouraged to stop smoking.  In 6 months.  Greater than 50% of this 25 minute face to face visit was spent in counseling and discussion and coordination of care regarding the above  diagnosis/diagnosies

## 2018-01-21 ENCOUNTER — Encounter: Payer: Self-pay | Admitting: Obstetrics and Gynecology

## 2018-01-21 ENCOUNTER — Ambulatory Visit (INDEPENDENT_AMBULATORY_CARE_PROVIDER_SITE_OTHER): Payer: 59 | Admitting: Obstetrics and Gynecology

## 2018-01-21 ENCOUNTER — Other Ambulatory Visit: Payer: Self-pay

## 2018-01-21 ENCOUNTER — Ambulatory Visit (INDEPENDENT_AMBULATORY_CARE_PROVIDER_SITE_OTHER): Payer: 59

## 2018-01-21 VITALS — BP 100/64 | HR 66 | Ht 63.75 in | Wt 133.8 lb

## 2018-01-21 DIAGNOSIS — N83299 Other ovarian cyst, unspecified side: Secondary | ICD-10-CM | POA: Diagnosis not present

## 2018-01-21 DIAGNOSIS — D219 Benign neoplasm of connective and other soft tissue, unspecified: Secondary | ICD-10-CM | POA: Diagnosis not present

## 2018-01-21 DIAGNOSIS — N9489 Other specified conditions associated with female genital organs and menstrual cycle: Secondary | ICD-10-CM | POA: Diagnosis not present

## 2018-01-21 NOTE — Progress Notes (Signed)
GYNECOLOGY  VISIT   HPI: 42 y.o.   Divorced  Caucasian  female   [phone] with Patient's last menstrual period was 10/29/2017 (exact date).   here for pelvic ultrasound to follow up on Lt.ovarian cyst.   Taking Megace to control menses, and it is working.  She has no bleeding with this unless she skips a pill.  Is having some weight gain.  She does want to continue this.  Did a course of Provera and had no improvement of her bleeding prior to this.   Prior pelvic US on 11/26/17: Uterus with multiple fibroids, largest 33 mm.  EMS 2.12 mm. Right ovary absent.  Left ovary with 68 x 36 x 22 nn complex ovarian cyst.  Septations 1.5 mm.  Avascular.  No free fluid.  Planning hysterectomy and left salpingo-oophorectomy with pelvic washings, anterior colporrhaphy, TVT Exact midurethral sling in Jan. 2020 for symptomatic fibroids, complex left ovarian cyst, cystocele and genuine stress incontinence.  Her uterus is 12 week size.  EMB benign.  CA125 is 40 on 01/01/18. Hgb 13.2 on 11/17/17. TSH 0.741 on 11/19/17.   Really interested to proceed with surgery next year and would like for her left tube and ovary to be removed due to her prior hx of ovarian cyst formation.   Saw PCP yesterday and starting Wellbutrin for depression and anxiety.  Is a smoker.   GYNECOLOGIC HISTORY: Patient's last menstrual period was 10/29/2017 (exact date). Contraception: Tubal Menopausal hormone therapy:  none Last mammogram:  09/01/17 [place] Right BIRADS2:benign  Last pap smear:   12/16/17 ASCUS. HR HPV:neg         OB History    Gravida  4   Para  3   Term  0   Preterm  0   AB  1   Living  3     SAB  1   TAB  0   Ectopic  0   Multiple  0   Live Births  0              Patient Active Problem List   Diagnosis Date Noted  . Ovarian cyst   . Female bladder prolapse 12/19/2017  . Genuine stress incontinence, female 12/19/2017  . Left ovarian cyst 12/19/2017  . Anxiety as acute reaction to  exceptional stress 02/05/2015  . Mixed incontinence urge and stress 09/01/2013  . Menorrhagia 09/01/2013  . Dyspareunia 09/01/2013  . Dysmenorrhea 09/01/2013  . Breast nodule 08/12/2013  . Menorrhagia 08/12/2013  . Dysmenorrhea 08/12/2013  . UI (urinary incontinence) 08/12/2013  . Back pain 01/06/2011  . Neck pain 01/06/2011  . NICOTINE ADDICTION 12/28/2008  . FATIGUE 12/28/2008  . UNSPECIFIED URINARY INCONTINENCE 12/28/2008    Past Medical History:  Diagnosis Date  . Anxiety   . Breast nodule 08/12/2013  . Depression   . Dysmenorrhea 08/12/2013  . Fibroid   . Menorrhagia 08/12/2013  . Ovarian cyst    left - 68 x 36 x 22 mm, complex, CA125 40  . UI (urinary incontinence) 08/12/2013    Past Surgical History:  Procedure Laterality Date  . APPENDECTOMY    . BREAST CYST EXCISION    . CESAREAN SECTION    . OOPHORECTOMY Right   . TUBAL LIGATION      Current Outpatient Medications  Medication Sig Dispense Refill  . buPROPion (WELLBUTRIN XL) 150 MG 24 hr tablet One tablet po Q am for 7 days, then two po Q am 60 tablet 0  . buPROPion [facility]  XL) 300 MG 24 hr tablet Take 1 tablet (300 mg total) by mouth daily. Hold on file until pt requests 30 tablet 5  . clonazePAM (KLONOPIN) 0.5 MG tablet 1/2-1 po BID prn anxiety 40 tablet 1  . megestrol (MEGACE) 40 MG tablet Take 1 tablet (40 mg total) by mouth 2 (two) times daily. 60 tablet 1   No current facility-administered medications for this visit.      ALLERGIES: Zoloft [sertraline hcl]  Family History  Problem Relation Age of Onset  . Dementia Father   . Bipolar disorder Sister   . Cancer Maternal Grandmother        breast  . Stroke Maternal Grandfather   . Alzheimer's disease Paternal Grandmother   . Heart attack Paternal Grandfather     Social History   Socioeconomic History  . Marital status: Divorced    Spouse name: Not on file  . Number of children: Not on file  . Years of education: Not on file  . Highest  education level: Not on file  Occupational History  . Not on file  Social Needs  . Financial resource strain: Not on file  . Food insecurity:    Worry: Not on file    Inability: Not on file  . Transportation needs:    Medical: Not on file    Non-medical: Not on file  Tobacco Use  . Smoking status: Current Every Day Smoker    Packs/day: 1.00  . Smokeless tobacco: Never Used  Substance and Sexual Activity  . Alcohol use: Yes    Comment: occasionally  . Drug use: Never  . Sexual activity: Yes    Birth control/protection: None, Surgical  Lifestyle  . Physical activity:    Days per week: Not on file    Minutes per session: Not on file  . Stress: Not on file  Relationships  . Social connections:    Talks on phone: Not on file    Gets together: Not on file    Attends religious service: Not on file    Active member of club or organization: Not on file    Attends meetings of clubs or organizations: Not on file    Relationship status: Not on file  . Intimate partner violence:    Fear of current or ex partner: Not on file    Emotionally abused: Not on file    Physically abused: Not on file    Forced sexual activity: Not on file  Other Topics Concern  . Not on file  Social History Narrative  . Not on file    Review of Systems  All other systems reviewed and are negative.   PHYSICAL EXAMINATION:    BP 100/64 (BP Location: Right Arm, Patient Position: Sitting, Cuff Size: Normal)   Pulse 66   Ht 5' 3.75" (1.619 m)   Wt 133 lb 12.8 oz (60.7 kg)   LMP 10/29/2017 (Exact Date)   BMI 23.15 kg/m     General appearance: alert, cooperative and appears stated age   Pelvic US Uterus with multiple fibroids - no change.  EMS 4.82 mm.  Right ovary absent. Left ovary normal.  Left adnexal mass - 4.36 x 2.04 x 1.74 mm elongated and thick walled with scattered echoes and ?septations consistent with hematosalpinx? No free fluid.  ASSESSMENT  Fibroids.  Left adnexal mass - looks  like tubal enlargement.  Minimally elevated CA125 2 weeks ago.  Depression and anxiety - now starting Wellbutrin.   PLAN  Will  continue Megace for cycle control.  Will check CA125.  Likely will proceed with surgery for fibroids, left adnexal mass, and stress incontinence in January 2020.  If surgery is not done, she will need a next pelvic US likely in 3 months.    An After Visit Summary was printed and given to the patient.  ___15___ minutes face to face time of which over 50% was spent in counseling.

## 2018-01-21 NOTE — Progress Notes (Signed)
Encounter reviewed by Dr. Deannie Resetar Amundson C. Silva.  

## 2018-01-22 ENCOUNTER — Other Ambulatory Visit: Payer: Self-pay | Admitting: *Deleted

## 2018-01-22 DIAGNOSIS — N7011 Chronic salpingitis: Secondary | ICD-10-CM

## 2018-01-22 LAB — CA 125: CANCER ANTIGEN (CA) 125: 37.2 U/mL (ref 0.0–38.1)

## 2018-01-23 DIAGNOSIS — F411 Generalized anxiety disorder: Secondary | ICD-10-CM | POA: Insufficient documentation

## 2018-01-23 DIAGNOSIS — F321 Major depressive disorder, single episode, moderate: Secondary | ICD-10-CM | POA: Insufficient documentation

## 2018-02-03 MED FILL — clonazePAM 0.5 MG TABS: 0.5 | 20 days supply | Qty: 40 | Fill #0

## 2018-02-10 MED FILL — buPROPion HCL ER (XL) 300 M: 300 | 30 days supply | Qty: 30 | Fill #0

## 2018-02-10 MED FILL — MEGESTROL 40 MG TABLET: 40 | 30 days supply | Qty: 60 | Fill #0

## 2018-02-11 ENCOUNTER — Other Ambulatory Visit: Payer: 59 | Admitting: Obstetrics and Gynecology

## 2018-02-11 ENCOUNTER — Other Ambulatory Visit: Payer: 59

## 2018-02-26 ENCOUNTER — Ambulatory Visit: Payer: 59 | Admitting: Family Medicine

## 2018-03-12 ENCOUNTER — Encounter: Payer: Self-pay | Admitting: Family Medicine

## 2018-03-12 ENCOUNTER — Ambulatory Visit: Payer: 59 | Admitting: Family Medicine

## 2018-03-12 VITALS — BP 126/80 | Ht 63.75 in | Wt 131.0 lb

## 2018-03-12 DIAGNOSIS — F321 Major depressive disorder, single episode, moderate: Secondary | ICD-10-CM

## 2018-03-12 DIAGNOSIS — F411 Generalized anxiety disorder: Secondary | ICD-10-CM

## 2018-03-12 MED ORDER — BUPROPION HCL ER (XL) 150 MG PO TB24: ORAL_TABLET | ORAL | 5 refills | Status: DC

## 2018-03-12 MED ORDER — CLONAZEPAM 1 MG PO TABS: ORAL_TABLET | ORAL | 3 refills | Status: DC

## 2018-03-12 MED ORDER — BUPROPION HCL ER (XL) 300 MG PO TB24: 300.0000 mg | ORAL_TABLET | Freq: Every day | ORAL | 5 refills | Status: DC

## 2018-03-12 NOTE — Progress Notes (Signed)
   Subjective:    Patient ID: HALEIGH DESMITH, female    DOB: June 02, 1975, 42 y.o.   MRN: [id number]  Depression         This is a chronic problem. takes wellbutrin 300mg  daily.   Also takes klonopin for anxiety. Still having some panic attacks.   Stress level   Much berter , lss stress   Overall s  Less stress  Exercise   p  Last two day s   wellbutin  xl has helped.  However patient feels she needs to be on a higher dose.  Still having panic attacks.  Still stressed out a lot.  Still having some challenging days.  Affects sleep at times.  Klonopin helps anxiety but patient feels she may need stronger dose   Review of Systems  Psychiatric/Behavioral: Positive for depression.       Objective:   Physical Exam  Alert active no acute distress lungs clear.  Heart regular rhythm HEENT normal.      Assessment & Plan:  Impression generalized anxiety disorder/depression.  Clinically improved.  Still not ideal.  Long discussion held.  Will increase Wellbutrin XL to 450 every morning.  Also will somewhat increased dose itching on the Klonopin.  Exercise encouraged.  Follow-up as scheduled

## 2018-03-15 ENCOUNTER — Other Ambulatory Visit: Payer: Self-pay | Admitting: Obstetrics and Gynecology

## 2018-03-15 MED FILL — buPROPion HCL ER (XL) 300 M: 300 | 30 days supply | Qty: 30 | Fill #1

## 2018-03-15 MED FILL — MEGESTROL 40 MG TABLET: 40 | 30 days supply | Qty: 60 | Fill #0

## 2018-03-15 MED FILL — buPROPion HCL ER (XL) 150 M: 150 | 30 days supply | Qty: 30 | Fill #0

## 2018-03-15 NOTE — Telephone Encounter (Signed)
Call to patient. States she is still considering surgery "in next month or two."  Advised recommend PUS for recheck if not actively planning surgery.  Patient agreeable to ultrasound. Appointment scheduled for 04-01-18 at 0900.  Patient aware refill for one month sent per Dr Quincy Simmonds.  Encounter closed.

## 2018-03-15 NOTE — Telephone Encounter (Signed)
Medication refill request: Megace Last OV:  01/21/18 Adnexal mass/Fibroids Next AEX: none Last MMG (if hormonal medication request): 09/01/17 [place] Right BIRADS2:benign Refill authorized: 01/07/18 #60 w/1 refill; today please advise

## 2018-03-15 NOTE — Telephone Encounter (Signed)
Please contact patient in follow up to her request for refill of Megace.  I will approve one month of this Rx.  We need to determine what her next steps in care will be.  She planned to delay surgery until January, 2020.

## 2018-03-19 MED FILL — clonazePAM 1 MG TABS: 1 | 15 days supply | Qty: 30 | Fill #0

## 2018-03-30 ENCOUNTER — Telehealth: Payer: Self-pay | Admitting: Obstetrics and Gynecology

## 2018-03-30 NOTE — Telephone Encounter (Signed)
Triage, please reach out to patient to reschedule appointment.  At a minimum, she needs a recheck with me. She has been on Megace for period control. We can decide about her follow up ultrasound at that time.   Cc - Lamont Snowball, Leeroy Bock

## 2018-03-30 NOTE — Telephone Encounter (Signed)
Patient called and cancelled her ultrasound for Thursday, 04/01/18, with Dr. Quincy Simmonds. She said she has to work that day and will need to call back to reschedule. Routing to provider for FYI.  Cc: Suzy/[name]

## 2018-03-31 NOTE — Telephone Encounter (Signed)
Left message to call Sharee Pimple, RN at [facility].   PATIENT MAY SPEAK WITH ANY AVAILABLE NURSE

## 2018-04-01 ENCOUNTER — Other Ambulatory Visit: Payer: 59

## 2018-04-01 ENCOUNTER — Other Ambulatory Visit: Payer: 59 | Admitting: Obstetrics and Gynecology

## 2018-04-06 NOTE — Telephone Encounter (Signed)
Call to patient regarding cancelled pelvic ultrasound.  States she now works 3rd shift and was unable to keep previous appointment. Ultrasound rescheduled to 04-08-2018 at 9 am.   Encounter closed.

## 2018-04-07 ENCOUNTER — Telehealth: Payer: Self-pay | Admitting: Obstetrics and Gynecology

## 2018-04-07 NOTE — Telephone Encounter (Signed)
Call placed to convey benefits for procedure. °

## 2018-04-08 ENCOUNTER — Ambulatory Visit (INDEPENDENT_AMBULATORY_CARE_PROVIDER_SITE_OTHER): Payer: 59 | Admitting: Obstetrics and Gynecology

## 2018-04-08 ENCOUNTER — Encounter: Payer: Self-pay | Admitting: Obstetrics and Gynecology

## 2018-04-08 ENCOUNTER — Ambulatory Visit (INDEPENDENT_AMBULATORY_CARE_PROVIDER_SITE_OTHER): Payer: 59

## 2018-04-08 VITALS — BP 110/78 | HR 72 | Resp 14 | Ht 64.0 in | Wt 134.0 lb

## 2018-04-08 DIAGNOSIS — D219 Benign neoplasm of connective and other soft tissue, unspecified: Secondary | ICD-10-CM | POA: Diagnosis not present

## 2018-04-08 DIAGNOSIS — N7011 Chronic salpingitis: Secondary | ICD-10-CM

## 2018-04-08 DIAGNOSIS — N6002 Solitary cyst of left breast: Secondary | ICD-10-CM

## 2018-04-08 DIAGNOSIS — N6001 Solitary cyst of right breast: Secondary | ICD-10-CM | POA: Diagnosis not present

## 2018-04-08 DIAGNOSIS — N939 Abnormal uterine and vaginal bleeding, unspecified: Secondary | ICD-10-CM | POA: Diagnosis not present

## 2018-04-08 DIAGNOSIS — N393 Stress incontinence (female) (male): Secondary | ICD-10-CM | POA: Diagnosis not present

## 2018-04-08 NOTE — Progress Notes (Signed)
GYNECOLOGY  VISIT   HPI: 43 y.o.   Divorced  Caucasian  female   [phone] with Patient's last menstrual period was 10/29/2017.   here for ultrasound and recheck.   Bleeds if she misses a couple of doses of Megace.   Patient is on Megace for abnormal uterine bleeding.  She has fibroids.  She has had a negative EMB.  She has a know left adnexal mass which is most consistent with a hydrosalpinx.  CA125 = 37.2 on 01/21/18.  Previous CA125 = 40.0 on 01/01/18.   She has been contemplating hysterectomy surgery and potential midurethral sling for stress incontinence.   Quit smoking 03/24/18.  Denies HTN, migraine HA with aura, breast or liver disease, thromboembolic events or family hs of this.   GYNECOLOGIC HISTORY: Patient's last menstrual period was 10/29/2017. Contraception:  Tubal  Menopausal hormone therapy:  none Last mammogram:  09/01/17 [place] Right BIRADS 2:benign Last pap smear:   12/16/17 ASCUS; Neg HR HPV        OB History    Gravida  4   Para  3   Term  0   Preterm  0   AB  1   Living  3     SAB  1   TAB  0   Ectopic  0   Multiple  0   Live Births  0              Patient Active Problem List   Diagnosis Date Noted  . Depression, major, single episode, moderate ([place]) 01/23/2018  . Generalized anxiety disorder 01/23/2018  . Ovarian cyst   . Female bladder prolapse 12/19/2017  . Genuine stress incontinence, female 12/19/2017  . Left ovarian cyst 12/19/2017  . Anxiety as acute reaction to exceptional stress 02/05/2015  . Mixed incontinence urge and stress 09/01/2013  . Menorrhagia 09/01/2013  . Dyspareunia 09/01/2013  . Dysmenorrhea 09/01/2013  . Breast nodule 08/12/2013  . Menorrhagia 08/12/2013  . Dysmenorrhea 08/12/2013  . UI (urinary incontinence) 08/12/2013  . Back pain 01/06/2011  . Neck pain 01/06/2011  . NICOTINE ADDICTION 12/28/2008  . FATIGUE 12/28/2008  . UNSPECIFIED URINARY INCONTINENCE 12/28/2008    Past Medical History:   Diagnosis Date  . Anxiety   . Breast nodule 08/12/2013  . Depression   . Dysmenorrhea 08/12/2013  . Fibroid   . Menorrhagia 08/12/2013  . Ovarian cyst    left - 68 x 36 x 22 mm, complex, CA125 40  . UI (urinary incontinence) 08/12/2013    Past Surgical History:  Procedure Laterality Date  . APPENDECTOMY    . BREAST CYST EXCISION    . CESAREAN SECTION    . OOPHORECTOMY Right   . TUBAL LIGATION      Current Outpatient Medications  Medication Sig Dispense Refill  . buPROPion (WELLBUTRIN XL) 150 MG 24 hr tablet Take one daily 30 tablet 5  . buPROPion (WELLBUTRIN XL) 300 MG 24 hr tablet Take 1 tablet (300 mg total) by mouth daily. 30 tablet 5  . clonazePAM (KLONOPIN) 1 MG tablet 1/2-1 po BID prn anxiety 30 tablet 3  . megestrol (MEGACE) 40 MG tablet TAKE 1 TABLET BY MOUTH TWICE DAILY 60 tablet 0   No current facility-administered medications for this visit.      ALLERGIES: Zoloft [sertraline hcl]  Family History  Problem Relation Age of Onset  . Dementia Father   . Bipolar disorder Sister   . Cancer Maternal Grandmother  breast  . Stroke Maternal Grandfather   . Alzheimer's disease Paternal Grandmother   . Heart attack Paternal Grandfather     Social History   Socioeconomic History  . Marital status: Divorced    Spouse name: Not on file  . Number of children: Not on file  . Years of education: Not on file  . Highest education level: Not on file  Occupational History  . Not on file  Social Needs  . Financial resource strain: Not on file  . Food insecurity:    Worry: Not on file    Inability: Not on file  . Transportation needs:    Medical: Not on file    Non-medical: Not on file  Tobacco Use  . Smoking status: Current Every Day Smoker    Packs/day: 1.00  . Smokeless tobacco: Never Used  Substance and Sexual Activity  . Alcohol use: Yes    Comment: occasionally  . Drug use: Never  . Sexual activity: Yes    Birth control/protection: None, Surgical   Lifestyle  . Physical activity:    Days per week: Not on file    Minutes per session: Not on file  . Stress: Not on file  Relationships  . Social connections:    Talks on phone: Not on file    Gets together: Not on file    Attends religious service: Not on file    Active member of club or organization: Not on file    Attends meetings of clubs or organizations: Not on file    Relationship status: Not on file  . Intimate partner violence:    Fear of current or ex partner: Not on file    Emotionally abused: Not on file    Physically abused: Not on file    Forced sexual activity: Not on file  Other Topics Concern  . Not on file  Social History Narrative  . Not on file    Review of Systems  Constitutional: Negative.   HENT: Negative.   Eyes: Negative.   Respiratory: Negative.   Cardiovascular: Negative.   Gastrointestinal: Negative.   Endocrine: Negative.   Genitourinary:       Excess bleeding Painful periods Menstrual cycle changes Unscheduled bleeding or spotting  Musculoskeletal: Negative.   Skin: Negative.   Allergic/Immunologic: Negative.   Neurological: Negative.   Hematological: Negative.   Psychiatric/Behavioral: Negative.     PHYSICAL EXAMINATION:    BP 110/78 (BP Location: Left Arm, Patient Position: Sitting, Cuff Size: Normal)   Pulse 72   Resp 14   Ht 5\' 4"  (1.626 m)   Wt 134 lb (60.8 kg)   LMP 10/29/2017   BMI 23.00 kg/m     General appearance: alert, cooperative and appears stated age  Pelvic US Fibroids - stable.  EMS 13.05 mm.  Right adnexa absent.  Left ovary with follicles.  Left adnexal tubular cystic structure, now smaller at 34 x 14 x 15 mm.  No free fluid.   ASSESSMENT  Status post BTL.  Smoker over age 15.  Just quit.   Status post right oophorectomy for dermoid.  Abnormal uterine bleeding controlled on Megace.  Fibroids.  Left adnexal mass consistent most likely with hydrosalpinx.  Borderline elevation of CA125, which  normalized.  Stress incontinence.   PLAN  We reviewed her [place] and options for care:  COCs-NuvaRing-OrthoEvra if she stops smoking for 3 months, Mirena IUD, laparoscopic hysterectomy with LSO, TVT and cysto. Her preference is surgery but would like to precert  Mirena and surgical care.  I discussed Mirena IUD risks and benefits as well as combine contraception risks and benefits.   She understands that Megace is not a long term option for her.  Withdrawal bleeding with stopping Megace is expected.   An After Visit Summary was printed and given to the patient.  __25____ minutes face to face time of which over 50% was spent in counseling.

## 2018-04-08 NOTE — Progress Notes (Signed)
Encounter reviewed by Dr. Shreyas Piatkowski Amundson C. Silva.  

## 2018-04-08 NOTE — Patient Instructions (Signed)

## 2018-04-12 MED FILL — buPROPion HCL ER (XL) 150 M: 150 | 30 days supply | Qty: 30 | Fill #1

## 2018-04-23 ENCOUNTER — Other Ambulatory Visit: Payer: Self-pay | Admitting: Obstetrics and Gynecology

## 2018-04-23 MED ORDER — MEGESTROL ACETATE 40 MG PO TABS: 40.0000 mg | ORAL_TABLET | Freq: Two times a day (BID) | ORAL | 0 refills | Status: DC

## 2018-04-23 NOTE — Telephone Encounter (Signed)
Medication refill request: Megace Last AEX:  04/08/18 BS Next AEX: none scheduled Last MMG (if hormonal medication request): 09/01/17 [place] Right BIRADS 2:benign Refill authorized: 03/15/18 #60 w/0 refills; today please advise; Order pended for #60 w/0 refills

## 2018-04-26 MED FILL — MEGESTROL 40 MG TABLET: 40 | 30 days supply | Qty: 60 | Fill #0

## 2018-04-27 ENCOUNTER — Encounter: Payer: Self-pay | Admitting: Obstetrics and Gynecology

## 2018-04-28 ENCOUNTER — Telehealth: Payer: Self-pay | Admitting: Obstetrics and Gynecology

## 2018-04-28 DIAGNOSIS — N939 Abnormal uterine and vaginal bleeding, unspecified: Secondary | ICD-10-CM

## 2018-04-28 MED FILL — buPROPion HCL ER (XL) 300 M: 300 | 30 days supply | Qty: 30 | Fill #2

## 2018-04-28 NOTE — Telephone Encounter (Signed)
Patient sent the following correspondence through [facility]. Routing to triage to assist patient with request.  Would like a follow up regarding the IUD. No one has contacted me about IUD cost or getting the IUD instead of Hysterectomy

## 2018-04-28 NOTE — Telephone Encounter (Signed)
Call placed to convey benefits for Mirena. °

## 2018-04-28 NOTE — Telephone Encounter (Signed)
Spoke with patient regarding benefit for a Mirena IUD insertion. Patient understood and agreeable. Patient ready to schedule. Patient scheduled 05/03/2018  with Dr Quincy Simmonds. Patient aware of appointment date, arrival time and  cancellation policy. No further questions.   Forwarding to Dr Quincy Simmonds for final review. Patient is agreeable to disposition. Will close encounter  cc: Lamont Snowball, RN

## 2018-04-28 NOTE — Telephone Encounter (Signed)
The patient has been considering Mirena IUD versus laparoscopic hysterectomy with left salpingo-oophorectomy/TVT midurethral sling and cystoscopy.   Anna Gonzalez

## 2018-04-28 NOTE — Telephone Encounter (Signed)
Anna Gonzalez/[name] please precert Mirena IUD as soon as possible and contact patient.   Patient has a history of a tubal ligation so it will not be used for contraception, will be used to treat abnormal uterine bleeding.

## 2018-04-30 NOTE — Progress Notes (Signed)
GYNECOLOGY  VISIT   HPI: 43 y.o.   Divorced  Caucasian  female   [phone] with Patient's last menstrual period was 05/03/2018 (exact date).   here for Mirena IUD insertion.    She wants to stop the Megace.  She is having hair loss.   Patient with urinary frequency/urgency and was Tx'd for UTI by Urgent Care 04-30-18 and given Cipro for 7 days. She feels some better but not great. Prior to Friday was treated by telephone on 04-28-18 with Macrodantin for 2 days but this didn't help at all. States her discomfort is eased but not gone.   No fever, shakes or shills. No nausea.  No back pain.   States her pain is a 2 - 3/10.  Feels a pressure.  Feels really sore when she is standing.   Has some spotting today.  She ran out of her Megace when her pain started.  She started this again   Patient states Urgent care did not send for a culture.  Sexually active and no change in her partner.   UPT:Neg Urine Dip: Trace WBCs, Trace RBCs  GYNECOLOGIC HISTORY: Patient's last menstrual period was 05/03/2018 (exact date). Contraception: Tubal Menopausal hormone therapy:  none Last mammogram:  09/01/17 [place] Right BIRADS 2:benign Last pap smear:   12/16/17 ASCUS; Neg HR HPV                               08-12-13 Neg:Neg HR HPV OB History    Gravida  4   Para  3   Term  0   Preterm  0   AB  1   Living  3     SAB  1   TAB  0   Ectopic  0   Multiple  0   Live Births  0              Patient Active Problem List   Diagnosis Date Noted  . Bilateral breast cysts 04/08/2018  . Depression, major, single episode, moderate ([place]) 01/23/2018  . Generalized anxiety disorder 01/23/2018  . Adnexal cyst   . Female bladder prolapse 12/19/2017  . Genuine stress incontinence, female 12/19/2017  . Anxiety as acute reaction to exceptional stress 02/05/2015  . Mixed incontinence urge and stress 09/01/2013  . Dyspareunia 09/01/2013  . Menorrhagia 08/12/2013  . Dysmenorrhea 08/12/2013  . Back  pain 01/06/2011  . Neck pain 01/06/2011  . NICOTINE ADDICTION 12/28/2008  . FATIGUE 12/28/2008  . UNSPECIFIED URINARY INCONTINENCE 12/28/2008    Past Medical History:  Diagnosis Date  . Anxiety   . Breast nodule 08/12/2013  . Depression   . Dysmenorrhea 08/12/2013  . Fibroid   . Menorrhagia 08/12/2013  . Ovarian cyst    left - 68 x 36 x 22 mm, complex, CA125 40  . UI (urinary incontinence) 08/12/2013    Past Surgical History:  Procedure Laterality Date  . APPENDECTOMY    . BREAST CYST EXCISION    . CESAREAN SECTION    . OOPHORECTOMY Right   . TUBAL LIGATION      Current Outpatient Medications  Medication Sig Dispense Refill  . buPROPion (WELLBUTRIN XL) 150 MG 24 hr tablet Take one daily 30 tablet 5  . buPROPion (WELLBUTRIN XL) 300 MG 24 hr tablet Take 1 tablet (300 mg total) by mouth daily. 30 tablet 5  . clonazePAM (KLONOPIN) 1 MG tablet 1/2-1 po BID prn anxiety 30 tablet  3  . megestrol (MEGACE) 40 MG tablet Take 1 tablet (40 mg total) by mouth 2 (two) times daily. 60 tablet 0   No current facility-administered medications for this visit.      ALLERGIES: Zoloft [sertraline hcl]  Family History  Problem Relation Age of Onset  . Dementia Father   . Bipolar disorder Sister   . Cancer Maternal Grandmother        breast  . Stroke Maternal Grandfather   . Alzheimer's disease Paternal Grandmother   . Heart attack Paternal Grandfather     Social History   Socioeconomic History  . Marital status: Divorced    Spouse name: Not on file  . Number of children: Not on file  . Years of education: Not on file  . Highest education level: Not on file  Occupational History  . Not on file  Social Needs  . Financial resource strain: Not on file  . Food insecurity:    Worry: Not on file    Inability: Not on file  . Transportation needs:    Medical: Not on file    Non-medical: Not on file  Tobacco Use  . Smoking status: Current Every Day Smoker    Packs/day: 1.00  .  Smokeless tobacco: Never Used  Substance and Sexual Activity  . Alcohol use: Yes    Comment: occasionally  . Drug use: Never  . Sexual activity: Yes    Birth control/protection: None, Surgical  Lifestyle  . Physical activity:    Days per week: Not on file    Minutes per session: Not on file  . Stress: Not on file  Relationships  . Social connections:    Talks on phone: Not on file    Gets together: Not on file    Attends religious service: Not on file    Active member of club or organization: Not on file    Attends meetings of clubs or organizations: Not on file    Relationship status: Not on file  . Intimate partner violence:    Fear of current or ex partner: Not on file    Emotionally abused: Not on file    Physically abused: Not on file    Forced sexual activity: Not on file  Other Topics Concern  . Not on file  Social History Narrative  . Not on file    Review of Systems  Genitourinary: Positive for dysuria, frequency and urgency.  All other systems reviewed and are negative.   PHYSICAL EXAMINATION:    BP (!) 100/2 (BP Location: Right Arm, Patient Position: Sitting, Cuff Size: Normal)   Pulse 80   Ht 5' 3.75" (1.619 m)   Wt 134 lb 6.4 oz (61 kg)   LMP 05/03/2018 (Exact Date) Comment: spotting  BMI 23.25 kg/m     General appearance: alert, cooperative and appears stated age   Pelvic: External genitalia:  no lesions              Urethra:  normal appearing urethra with no masses, tenderness or lesions              Bartholins and Skenes: normal                 Vagina: normal appearing vagina with normal color and discharge, no lesions              Cervix: no lesions                Bimanual Exam:  Uterus:  8 week size and tender over anterior surface.               Adnexa: no mass, fullness, tenderness            Pelvic US from jan 2020 reviewed with patient.   She has 3 fibroids, largest is about 4 cm and is posterior.  None encroach on the endometrium.  She  has a known left hydrosalpinx.   Chaperone was present for exam.  ASSESSMENT   Dysuria.  Pelvic pressure.  Recent discontinuation of Megace.  Fibroids and left hydrosalpinx.   PLAN  Urine micro and culture.  Continue Cipro.  [place] for AZO if needed.  Will return for Mirena IUD insertion after urine infection completely treated or UC is negative from today's visit.  She will continue on her Megace 40 mg bid until the IUD is placed, which would be expected to be shortly.    An After Visit Summary was printed and given to the patient.  __25____ minutes face to face time of which over 50% was spent in counseling.

## 2018-05-03 ENCOUNTER — Other Ambulatory Visit: Payer: Self-pay

## 2018-05-03 ENCOUNTER — Ambulatory Visit (INDEPENDENT_AMBULATORY_CARE_PROVIDER_SITE_OTHER): Payer: 59 | Admitting: Obstetrics and Gynecology

## 2018-05-03 ENCOUNTER — Encounter: Payer: Self-pay | Admitting: Obstetrics and Gynecology

## 2018-05-03 VITALS — BP 100/2 | HR 80 | Ht 63.75 in | Wt 134.4 lb

## 2018-05-03 DIAGNOSIS — R8290 Unspecified abnormal findings in urine: Secondary | ICD-10-CM | POA: Diagnosis not present

## 2018-05-03 DIAGNOSIS — R102 Pelvic and perineal pain: Secondary | ICD-10-CM | POA: Diagnosis not present

## 2018-05-03 DIAGNOSIS — N939 Abnormal uterine and vaginal bleeding, unspecified: Secondary | ICD-10-CM | POA: Diagnosis not present

## 2018-05-03 DIAGNOSIS — Z01812 Encounter for preprocedural laboratory examination: Secondary | ICD-10-CM | POA: Diagnosis not present

## 2018-05-03 DIAGNOSIS — R350 Frequency of micturition: Secondary | ICD-10-CM | POA: Diagnosis not present

## 2018-05-03 LAB — POCT URINALYSIS DIPSTICK
Bilirubin, UA: NEGATIVE
Glucose, UA: NEGATIVE
Ketones, UA: NEGATIVE
Nitrite, UA: NEGATIVE
Protein, UA: NEGATIVE
Urobilinogen, UA: 0.2 U/dL
pH, UA: 5

## 2018-05-03 LAB — POCT URINE PREGNANCY: Preg Test, Ur: NEGATIVE

## 2018-05-04 LAB — URINALYSIS, MICROSCOPIC ONLY: Casts: NONE SEEN /lpf

## 2018-05-04 LAB — URINE CULTURE: Organism ID, Bacteria: NO GROWTH

## 2018-05-05 ENCOUNTER — Emergency Department (HOSPITAL_COMMUNITY)
Admission: EM | Admit: 2018-05-05 | Discharge: 2018-05-05 | Disposition: A | Discharge status: Home / Self Care | Payer: 59 | Attending: Emergency Medicine | Admitting: Emergency Medicine

## 2018-05-05 ENCOUNTER — Encounter (HOSPITAL_COMMUNITY): Payer: Self-pay | Admitting: Emergency Medicine

## 2018-05-05 ENCOUNTER — Telehealth: Payer: Self-pay | Admitting: *Deleted

## 2018-05-05 ENCOUNTER — Other Ambulatory Visit: Payer: Self-pay | Admitting: *Deleted

## 2018-05-05 ENCOUNTER — Other Ambulatory Visit: Payer: Self-pay

## 2018-05-05 DIAGNOSIS — F17200 Nicotine dependence, unspecified, uncomplicated: Secondary | ICD-10-CM | POA: Insufficient documentation

## 2018-05-05 DIAGNOSIS — R319 Hematuria, unspecified: Secondary | ICD-10-CM | POA: Diagnosis not present

## 2018-05-05 DIAGNOSIS — Z79899 Other long term (current) drug therapy: Secondary | ICD-10-CM | POA: Insufficient documentation

## 2018-05-05 DIAGNOSIS — R300 Dysuria: Secondary | ICD-10-CM | POA: Insufficient documentation

## 2018-05-05 DIAGNOSIS — N939 Abnormal uterine and vaginal bleeding, unspecified: Secondary | ICD-10-CM

## 2018-05-05 LAB — URINALYSIS, ROUTINE W REFLEX MICROSCOPIC
Bacteria, UA: NONE SEEN
Bilirubin Urine: NEGATIVE
Glucose, UA: NEGATIVE mg/dL
Ketones, ur: NEGATIVE mg/dL
Leukocytes,Ua: NEGATIVE
Nitrite: NEGATIVE
PH: 5 (ref 5.0–8.0)
Protein, ur: 30 mg/dL — AB
SPECIFIC GRAVITY, URINE: 1.031 — AB (ref 1.005–1.030)

## 2018-05-05 MED ORDER — PHENAZOPYRIDINE HCL 100 MG PO TABS
200.0000 mg | ORAL_TABLET | Freq: Once | ORAL | Status: AC
  Administered 2018-05-05: 200 mg via ORAL
  Filled 2018-05-05: qty 2

## 2018-05-05 MED ORDER — CEPHALEXIN 500 MG PO CAPS: 500.0000 mg | ORAL_CAPSULE | Freq: Three times a day (TID) | ORAL | 0 refills | Status: DC

## 2018-05-05 MED ORDER — CEPHALEXIN 500 MG PO CAPS
500.0000 mg | ORAL_CAPSULE | Freq: Once | ORAL | Status: AC
  Administered 2018-05-05: 500 mg via ORAL
  Filled 2018-05-05: qty 1

## 2018-05-05 MED ORDER — PHENAZOPYRIDINE HCL 200 MG PO TABS: 200.0000 mg | ORAL_TABLET | Freq: Three times a day (TID) | ORAL | 0 refills | Status: DC

## 2018-05-05 NOTE — Telephone Encounter (Signed)
Ok to proceed with IUD insertion.

## 2018-05-05 NOTE — Telephone Encounter (Signed)
Routing to [employer].   Encounter closed.

## 2018-05-05 NOTE — ED Provider Notes (Signed)
[facility] EMERGENCY DEPARTMENT Provider Note   CSN: [id number] Arrival date & time: 05/05/18  0138  Time seen 2:55 AM   History   Chief Complaint Chief Complaint  Patient presents with  . Dysuria    HPI Anna Gonzalez is a 43 y.o. female.  HPI patient states on February 2 she started having dysuria and burning when she urinated.  She took Azo Gantrisin over-the-counter on February 3  and the fourth  without relief.  She states on the fifth she did a tele-doctor consult and they prescribed antibiotics.  She does not know the name of the antibiotic.  She states she took it for 2 days and stopped because it did not help.  She thinks it was supposed to have been for 5 days.  On the seventh she went to urgent care and they told her she had some blood in her urine and put her on Cipro.  However she states she is also on Megace for some irregular menstrual bleeding.  She was seen by her gynecologist on the 10th to get an IUD.  However they did not do it because of the recent infection.  A urine culture was sent at that time.  She continues to have dysuria and burning and frequency.  She states sometimes she sees blood when she wipes.  She denies fever, nausea, vomiting, domino pain, or flank pain.  She does have some lower abdominal pressure.  She states she has had UTIs before however that was at least a year ago.  She states she had a  ?cystogram done in December to check for bladder prolapse.  She states she has been having problems ever since.  PCP Mikey Kirschner, MD GYN Dr Quincy Simmonds Past Medical History:  Diagnosis Date  . Anxiety   . Breast nodule 08/12/2013  . Depression   . Dysmenorrhea 08/12/2013  . Fibroid   . Menorrhagia 08/12/2013  . Ovarian cyst    left - 68 x 36 x 22 mm, complex, CA125 40  . UI (urinary incontinence) 08/12/2013    Patient Active Problem List   Diagnosis Date Noted  . Bilateral breast cysts 04/08/2018  . Depression, major, single episode, moderate ([place])  01/23/2018  . Generalized anxiety disorder 01/23/2018  . Adnexal cyst   . Female bladder prolapse 12/19/2017  . Genuine stress incontinence, female 12/19/2017  . Anxiety as acute reaction to exceptional stress 02/05/2015  . Mixed incontinence urge and stress 09/01/2013  . Dyspareunia 09/01/2013  . Menorrhagia 08/12/2013  . Dysmenorrhea 08/12/2013  . Back pain 01/06/2011  . Neck pain 01/06/2011  . NICOTINE ADDICTION 12/28/2008  . FATIGUE 12/28/2008  . UNSPECIFIED URINARY INCONTINENCE 12/28/2008    Past Surgical History:  Procedure Laterality Date  . APPENDECTOMY    . BREAST CYST EXCISION    . CESAREAN SECTION    . OOPHORECTOMY Right   . TUBAL LIGATION       OB History    Gravida  4   Para  3   Term  0   Preterm  0   AB  1   Living  3     SAB  1   TAB  0   Ectopic  0   Multiple  0   Live Births  0            Home Medications    Prior to Admission medications   Medication Sig Start Date End Date Taking? Authorizing Provider  buPROPion (WELLBUTRIN XL)  150 MG 24 hr tablet Take one daily 03/12/18   Mikey Kirschner, MD  buPROPion (WELLBUTRIN XL) 300 MG 24 hr tablet Take 1 tablet (300 mg total) by mouth daily. 03/12/18   Mikey Kirschner, MD  cephALEXin (KEFLEX) 500 MG capsule Take 1 capsule (500 mg total) by mouth 3 (three) times daily. 05/05/18   Rolland Porter, MD  clonazePAM [place]bye [place]) 1 MG tablet 1/2-1 po BID prn anxiety 03/12/18   Mikey Kirschner, MD  megestrol (MEGACE) 40 MG tablet Take 1 tablet (40 mg total) by mouth 2 (two) times daily. 04/23/18   Nunzio Cobbs, MD  phenazopyridine (PYRIDIUM) 200 MG tablet Take 1 tablet (200 mg total) by mouth 3 (three) times daily. 05/05/18   Rolland Porter, MD    Family History Family History  Problem Relation Age of Onset  . Dementia Father   . Bipolar disorder Sister   . Cancer Maternal Grandmother        breast  . Stroke Maternal Grandfather   . Alzheimer's disease Paternal Grandmother   . Heart  attack Paternal Grandfather     Social History Social History   Tobacco Use  . Smoking status: Current Every Day Smoker    Packs/day: 1.00  . Smokeless tobacco: Never Used  Substance Use Topics  . Alcohol use: Yes    Comment: occasionally  . 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    Physical Exam Updated Vital Signs BP 119/75 (BP Location: Right Arm)   Pulse 86   Temp 98.4 F (36.9 C) (Oral)   Resp 18   Ht 5\' 3"  (1.6 m)   Wt 60.8 kg   LMP 05/03/2018 (Exact Date) Comment: spotting  SpO2 100%   BMI 23.74 kg/m   Physical Exam Vitals signs and nursing note reviewed.  Constitutional:      Appearance: Normal appearance.  HENT:     Head: Normocephalic and atraumatic.     Right Ear: External ear normal.     Left Ear: External ear normal.     Nose: Nose normal.     Mouth/Throat:     Mouth: Mucous membranes are moist.     Pharynx: No oropharyngeal exudate or posterior oropharyngeal erythema.  Eyes:     Extraocular Movements: Extraocular movements intact.     Conjunctiva/sclera: Conjunctivae normal.     Pupils: Pupils are equal, round, and reactive to light.  Neck:     Musculoskeletal: Normal range of motion.  Cardiovascular:     Rate and Rhythm: Normal rate.     Heart sounds: Normal heart sounds.  Pulmonary:     Effort: Pulmonary effort is normal. No respiratory distress.  Abdominal:     General: Abdomen is flat. Bowel sounds are normal.     Palpations: Abdomen is soft.     Tenderness: There is abdominal tenderness in the suprapubic area.     Comments: Mild tenderness, no CVA tenderness  Neurological:     Mental Status: She is alert.      ED Treatments / Results  Labs (all labs ordered are listed, but only abnormal results are displayed) Results for orders placed or performed during the hospital encounter of  05/05/18  Urinalysis, Routine w reflex microscopic  Result Value Ref Range   Color, Urine YELLOW YELLOW   APPearance  HAZY (A) CLEAR   Specific Gravity, Urine 1.031 (H) 1.005 - 1.030   pH 5.0 5.0 - 8.0   Glucose, UA NEGATIVE NEGATIVE mg/dL   Hgb urine dipstick MODERATE (A) NEGATIVE   Bilirubin Urine NEGATIVE NEGATIVE   Ketones, ur NEGATIVE NEGATIVE mg/dL   Protein, ur 30 (A) NEGATIVE mg/dL   Nitrite NEGATIVE NEGATIVE   Leukocytes,Ua NEGATIVE NEGATIVE   RBC / HPF 21-50 0 - 5 RBC/hpf   WBC, UA 0-5 0 - 5 WBC/hpf   Bacteria, UA NONE SEEN NONE SEEN   Squamous Epithelial / LPF 0-5 0 - 5   Mucus PRESENT    Laboratory interpretation all normal except hematuria    EKG None  Radiology No results found.  Procedures Procedures (including critical care time)  Urine Culture May 03, 2018 Order: [id number]  Status:  Final result    Specimen Information: Urine     Component 2d ago  Urine Culture, Routine Final report   Organism ID, Bacteria No growth          Medications Ordered in ED Medications  phenazopyridine (PYRIDIUM) tablet 200 mg (has no administration in time range)  cephALEXin (KEFLEX) capsule 500 mg (has no administration in time range)     Initial Impression / Assessment and Plan / ED Course  I have reviewed the triage vital signs and the nursing notes.  Pertinent labs & imaging results that were available during my care of the patient were reviewed by me and considered in my medical decision making (see chart for details).     Patient's urine culture from 2 days ago had no growth, I am wondering if there were some antibiotics in her urine that suppress the growth.  A repeat culture was sent.  She was switched to Keflex and put back on Pyridium.  There is significant resistance in this area to Cipro.  She was advised she should follow-up with urology if she is not improving.  Final Clinical Impressions(s) / ED Diagnoses   Final diagnoses:  Dysuria    Hematuria, unspecified type    ED Discharge Orders         Ordered    cephALEXin (KEFLEX) 500 MG capsule  3 times daily,   Status:  Discontinued     05/05/18 0324    phenazopyridine (PYRIDIUM) 200 MG tablet  3 times daily     05/05/18 0324    cephALEXin (KEFLEX) 500 MG capsule  3 times daily     05/05/18 0325         Plan discharge  Rolland Porter, MD, Barbette Or, MD 05/05/18 [phone]

## 2018-05-05 NOTE — ED Triage Notes (Signed)
UTI Feb. 2nd. Pt has been taking AZO, pt was prescribed medications, went to urgent care and was prescribed another antibiotic, and patient went to her OBGYN and has still not had any relief. Pt has had pain with urinating but now has pain all the time. Pt states she also has burning and pain with ambulating.

## 2018-05-05 NOTE — Discharge Instructions (Addendum)
Drink plenty of fluids.  Take the medications as prescribed.  If you are not improving over the next couple days consider being evaluated by a urologist.  Call their office to get an appointment.  Return to the emergency room if you get fever, vomiting, or worsening abdominal pain.

## 2018-05-05 NOTE — Telephone Encounter (Signed)
-----   Message from Nunzio Cobbs, MD sent at 05/05/2018  9:58 AM EST ----- Please report negative urine culture to patient.  Her urine micro did show crystals, which can be associated with kidney stones.  I do not think her pain is currently associated with an active stone being passed because she had no blood in her urine.  She needs to hydrate well.  She may return to have her IUD placed.

## 2018-05-05 NOTE — Telephone Encounter (Signed)
Notes recorded by Anna Logan, RN on 05/05/2018 at 12:22 PM EST Spoke with patient, advised as seen below per Dr. Quincy Simmonds. Patient states she was seen in ER on 2/11 for urinary symptoms, is scheduled for f/u with urology on 2/13. Patient is requesting to proceed with IUD insertion. Tubal for contraceptive. IUD insertion scheduled for 2/14 at 2:30pm with Dr. Quincy Simmonds. Advised I will review with Dr. Quincy Simmonds and rteurn call if any additional recommendations. Patient verbalizes understanding and is agreeable. Order placed for precert.   Dr. Quincy Simmonds -please review, ok to proceed as scheduled?

## 2018-05-06 DIAGNOSIS — R351 Nocturia: Secondary | ICD-10-CM | POA: Diagnosis not present

## 2018-05-06 DIAGNOSIS — R350 Frequency of micturition: Secondary | ICD-10-CM | POA: Diagnosis not present

## 2018-05-06 DIAGNOSIS — R8271 Bacteriuria: Secondary | ICD-10-CM | POA: Diagnosis not present

## 2018-05-06 DIAGNOSIS — N201 Calculus of ureter: Secondary | ICD-10-CM | POA: Diagnosis not present

## 2018-05-06 DIAGNOSIS — N200 Calculus of kidney: Secondary | ICD-10-CM | POA: Diagnosis not present

## 2018-05-06 LAB — URINE CULTURE: Culture: NO GROWTH

## 2018-05-07 ENCOUNTER — Other Ambulatory Visit: Payer: Self-pay

## 2018-05-07 ENCOUNTER — Ambulatory Visit: Payer: 59 | Admitting: Obstetrics and Gynecology

## 2018-05-07 ENCOUNTER — Other Ambulatory Visit (HOSPITAL_COMMUNITY)
Admission: RE | Admit: 2018-05-07 | Discharge: 2018-05-07 | Disposition: A | Discharge status: Home / Self Care | Payer: 59 | Source: Ambulatory Visit | Attending: Obstetrics and Gynecology | Admitting: Obstetrics and Gynecology

## 2018-05-07 ENCOUNTER — Encounter: Payer: Self-pay | Admitting: Obstetrics and Gynecology

## 2018-05-07 VITALS — BP 100/60 | HR 80 | Ht 63.75 in | Wt 134.0 lb

## 2018-05-07 DIAGNOSIS — R102 Pelvic and perineal pain: Secondary | ICD-10-CM

## 2018-05-07 DIAGNOSIS — N939 Abnormal uterine and vaginal bleeding, unspecified: Secondary | ICD-10-CM

## 2018-05-07 MED ORDER — CEFTRIAXONE SODIUM 250 MG IJ SOLR
250.0000 mg | Freq: Once | INTRAMUSCULAR | Status: AC
  Administered 2018-05-07: 250 mg via INTRAMUSCULAR

## 2018-05-07 MED ORDER — DOXYCYCLINE HYCLATE 100 MG PO CAPS: 100.0000 mg | ORAL_CAPSULE | Freq: Two times a day (BID) | ORAL | 0 refills | Status: DC

## 2018-05-07 NOTE — Progress Notes (Signed)
lGYNECOLOGY  VISIT   HPI: 43 y.o.   Divorced  Caucasian  female   [phone] with Patient's last menstrual period was 05/03/2018 (exact date).   here for potential Mirena IUD insertion.    Female friend present for the visit today.  Patient has had multiple visits to providers for dysuria in the last week.  She has had multiple antibiotics including Ciprofloxacin, Macrodantin, Keflex, and now Trimethoprim.  UC from 2/10 and 2/12 negative. Patient was seen in ER on 05-05-17 for dysuria. She has seen Dr. McDiarmid also.  She had a CT scan at [facility], and no stone seen at that time.  The radiologist is going to do an over read of this.  Dr. Matilde Sprang will plan for a cystoscopy next week if her symptoms are not better.   She is now taking Trimethoprim 100 mg daily and Meloxicam 15 mg daily.  She is not feeling an improvement in her pain.  Keflex is what helped the pain the best.  Hurts more with walking.   Using a heating pad on her vulva.   Having pelvic pain continuously.  Pain worse with urination.  Pain in the pubic area.  No blood in the urine.  She did when she first went in to urgent care.   No fever or chills.  No nausea or vomiting.  No diarrhea.  Constipation worsened with taking Megace. Last BM today.   Denies vaginal discharge.  No partner change in 6 years.   Feeling depressed.   She has known 3 fibroids (largest 38 mm) and a left hydrosalpinx measuring 34 x 14 x 15 mm.  Her right adnexa is absent surgically.  She is back on her Megace since 04/26/18. Not bleeding.  She has been deciding between hysterectomy and Mirena IUD and has focused on the Mirena lately.  UPT: Neg 05-03-18  GYNECOLOGIC HISTORY: Patient's last menstrual period was 05/03/2018 (exact date). Contraception:  tubal Menopausal hormone therapy:  none Last mammogram: 09/01/17 Diag.Bil.& [place] Right BIRADS--Multiple benign cysts within each breast/Neg/screening 46yr./BiRads2 Last pap smear:   12-16-17 ASCUS:Neg, 08-12-13 Neg:Neg HR HPV        OB History    Gravida  4   Para  3   Term  0   Preterm  0   AB  1   Living  3     SAB  1   TAB  0   Ectopic  0   Multiple  0   Live Births  0              Patient Active Problem List   Diagnosis Date Noted  . Bilateral breast cysts 04/08/2018  . Depression, major, single episode, moderate ([place]) 01/23/2018  . Generalized anxiety disorder 01/23/2018  . Adnexal cyst   . Female bladder prolapse 12/19/2017  . Genuine stress incontinence, female 12/19/2017  . Anxiety as acute reaction to exceptional stress 02/05/2015  . Mixed incontinence urge and stress 09/01/2013  . Dyspareunia 09/01/2013  . Menorrhagia 08/12/2013  . Dysmenorrhea 08/12/2013  . Back pain 01/06/2011  . Neck pain 01/06/2011  . NICOTINE ADDICTION 12/28/2008  . FATIGUE 12/28/2008  . UNSPECIFIED URINARY INCONTINENCE 12/28/2008    Past Medical History:  Diagnosis Date  . Anxiety   . Breast nodule 08/12/2013  . Depression   . Dysmenorrhea 08/12/2013  . Fibroid   . Menorrhagia 08/12/2013  . Ovarian cyst    left - 68 x 36 x 22 mm, complex, CA125 40  .  UI (urinary incontinence) 08/12/2013    Past Surgical History:  Procedure Laterality Date  . APPENDECTOMY    . BREAST CYST EXCISION    . CESAREAN SECTION    . OOPHORECTOMY Right   . TUBAL LIGATION      Current Outpatient Medications  Medication Sig Dispense Refill  . buPROPion (WELLBUTRIN XL) 150 MG 24 hr tablet Take one daily 30 tablet 5  . buPROPion (WELLBUTRIN XL) 300 MG 24 hr tablet Take 1 tablet (300 mg total) by mouth daily. 30 tablet 5  . clonazePAM (KLONOPIN) 1 MG tablet 1/2-1 po BID prn anxiety 30 tablet 3  . megestrol (MEGACE) 40 MG tablet Take 1 tablet (40 mg total) by mouth 2 (two) times daily. 60 tablet 0  . meloxicam (MOBIC) 15 MG tablet Take 1 tablet by mouth daily.    Marland Kitchen trimethoprim (TRIMPEX) 100 MG tablet Take 1 tablet by mouth daily.     No current facility-administered  medications for this visit.      ALLERGIES: Zoloft [sertraline hcl]  Family History  Problem Relation Age of Onset  . Dementia Father   . Bipolar disorder Sister   . Cancer Maternal Grandmother        breast  . Stroke Maternal Grandfather   . Alzheimer's disease Paternal Grandmother   . Heart attack Paternal Grandfather     Social History   Socioeconomic History  . Marital status: Divorced    Spouse name: Not on file  . Number of children: Not on file  . Years of education: Not on file  . Highest education level: Not on file  Occupational History  . Not on file  Social Needs  . Financial resource strain: Not on file  . Food insecurity:    Worry: Not on file    Inability: Not on file  . Transportation needs:    Medical: Not on file    Non-medical: Not on file  Tobacco Use  . Smoking status: Current Every Day Smoker    Packs/day: 1.00  . Smokeless tobacco: Never Used  Substance and Sexual Activity  . Alcohol use: Yes    Comment: occasionally  . Drug use: Never  . Sexual activity: Yes    Birth control/protection: None, Surgical  Lifestyle  . Physical activity:    Days per week: Not on file    Minutes per session: Not on file  . Stress: Not on file  Relationships  . Social connections:    Talks on phone: Not on file    Gets together: Not on file    Attends religious service: Not on file    Active member of club or organization: Not on file    Attends meetings of clubs or organizations: Not on file    Relationship status: Not on file  . Intimate partner violence:    Fear of current or ex partner: Not on file    Emotionally abused: Not on file    Physically abused: Not on file    Forced sexual activity: Not on file  Other Topics Concern  . Not on file  Social History Narrative  . Not on file    Review of Systems  All other systems reviewed and are negative.   PHYSICAL EXAMINATION:    BP 100/60 (BP Location: Right Arm, Patient Position: Sitting, Cuff  Size: Normal)   Pulse 80   Ht 5' 3.75" (1.619 m)   Wt 134 lb (60.8 kg)   LMP 05/03/2018 (Exact Date) Comment: spotting  BMI 23.18 kg/m     General appearance: alert, cooperative and appears stated age Lungs: clear to auscultation bilaterally Heart: regular rate and rhythm Abdomen: soft, non-tender, no masses,  no organomegaly Back:  No CVA tenderness.  Extremities: extremities normal, atraumatic, no cyanosis or edema No abnormal inguinal nodes palpated Neurologic: Grossly normal  Pelvic: External genitalia:  no lesions              Urethra:  normal appearing urethra with no masses, tenderness or lesions              Bartholins and Skenes: normal                 Vagina: normal appearing vagina with normal color and discharge, no lesions              Cervix: no lesions.  +/- CMT.                 Bimanual Exam:  Uterus:  10 week size, tender.              Adnexa: Left adnexal tenderness and fullness.  Right adnexa nontender.         Chaperone was present for exam.  ASSESSMENT  Pelvic pain. Possible PID.  Infarcted fibroid?  No acute abdomen, so I do not think that her left adnexa has torsed. Fibroids. Left hydrosalpinx. Status post RSO. Negative urine cultures.  Lack of improvement with multiple urinary oriented abx.    PLAN  No Mirena IUD placement at this time. Doxycycline 100 mg po bid x 14 days.  Rocephin 250 mg IM. Stop Trimethoprim.  OK to take Meloxicam.  GC/CT/trich/BV/yeast testing.  Will do serum testing if above STD testing is positive.  She will send me FLMA papers as she may need to be absent for a few days from work.  I recommended she does not work this weekend. She will return in 3 days for a recheck and go the the ER if she worsens. She will keep her appointment with Dr. Matilde Sprang for one week from now.  An After Visit Summary was printed and given to the patient.  _25_____ minutes face to face time of which over 50% was spent in counseling.

## 2018-05-10 ENCOUNTER — Ambulatory Visit: Payer: 59 | Admitting: Obstetrics and Gynecology

## 2018-05-10 ENCOUNTER — Other Ambulatory Visit: Payer: Self-pay

## 2018-05-10 ENCOUNTER — Encounter: Payer: Self-pay | Admitting: Obstetrics and Gynecology

## 2018-05-10 VITALS — BP 110/72 | HR 70 | Temp 98.1°F | Ht 63.75 in | Wt 135.8 lb

## 2018-05-10 DIAGNOSIS — R102 Pelvic and perineal pain: Secondary | ICD-10-CM

## 2018-05-10 LAB — CERVICOVAGINAL ANCILLARY ONLY
Bacterial vaginitis: POSITIVE — AB
CHLAMYDIA, DNA PROBE: NEGATIVE
Candida vaginitis: NEGATIVE
Neisseria Gonorrhea: NEGATIVE
Trichomonas: NEGATIVE

## 2018-05-10 MED FILL — clonazePAM 1 MG TABS: 1 | 15 days supply | Qty: 30 | Fill #1 | Status: TO

## 2018-05-10 MED FILL — buPROPion HCL ER (XL) 150 M: 150 | 30 days supply | Qty: 30 | Fill #2

## 2018-05-10 NOTE — Progress Notes (Signed)
Patient scheduled while in office for US pelvis complete with transvaginal at [facility] on 05/12/18 at 11:30am, arriving at 11:15am. Check in at [facility] main admitting.  Advised patient to arrive with full bladder, drink 32 oz of water 1 hr prior to appt. Patient verbalizes understanding and is agreeable.

## 2018-05-10 NOTE — Progress Notes (Signed)
GYNECOLOGY  VISIT   HPI: 43 y.o.   Divorced  Caucasian  female   [phone] with Patient's last menstrual period was 05/03/2018 (exact date).   here for follow up.  Her female friend is present today.  Patient still with pain/burning with urination and lower pelvic pressure.  Status post Ceftriaxone and now on doxycycline for presumed PID.  GC/CT were negative.  She has had multiple negative urine cultures and has been on multiple abx.  Had some nausea and loose stool.  She has fibroids and a know left hydrosalpinx.  She has been on Megace until recently when she stopped and then was to transition to a Mirena IUD.   She is seeing Dr. Matilde Sprang at the end of this week for a possible cystoscopy.  She is really worried about her absence from work and needs FMLA papers filled out for intermittent absence.  Temp: 98.1  GYNECOLOGIC HISTORY: Patient's last menstrual period was 05/03/2018 (exact date). Contraception:  Tubal Menopausal hormone therapy:  none Last mammogram:  09/01/17 Diag.Bil.& [place] Right BIRADS--Multiple benign cysts within each breast/Neg/screening 100yr./BiRads2 Last pap smear:   12-16-17 ASCUS:Neg, 08-12-13 Neg:Neg HR HPV        OB History    Gravida  4   Para  3   Term  0   Preterm  0   AB  1   Living  3     SAB  1   TAB  0   Ectopic  0   Multiple  0   Live Births  0              Patient Active Problem List   Diagnosis Date Noted  . Bilateral breast cysts 04/08/2018  . Depression, major, single episode, moderate ([place]) 01/23/2018  . Generalized anxiety disorder 01/23/2018  . Adnexal cyst   . Female bladder prolapse 12/19/2017  . Genuine stress incontinence, female 12/19/2017  . Anxiety as acute reaction to exceptional stress 02/05/2015  . Mixed incontinence urge and stress 09/01/2013  . Dyspareunia 09/01/2013  . Menorrhagia 08/12/2013  . Dysmenorrhea 08/12/2013  . Back pain 01/06/2011  . Neck pain 01/06/2011  . NICOTINE ADDICTION  12/28/2008  . FATIGUE 12/28/2008  . UNSPECIFIED URINARY INCONTINENCE 12/28/2008    Past Medical History:  Diagnosis Date  . Anxiety   . Breast nodule 08/12/2013  . Depression   . Dysmenorrhea 08/12/2013  . Fibroid   . Menorrhagia 08/12/2013  . Ovarian cyst    left - 68 x 36 x 22 mm, complex, CA125 40  . UI (urinary incontinence) 08/12/2013    Past Surgical History:  Procedure Laterality Date  . APPENDECTOMY    . BREAST CYST EXCISION    . CESAREAN SECTION    . OOPHORECTOMY Right   . TUBAL LIGATION      Current Outpatient Medications  Medication Sig Dispense Refill  . buPROPion (WELLBUTRIN XL) 150 MG 24 hr tablet Take one daily 30 tablet 5  . buPROPion (WELLBUTRIN XL) 300 MG 24 hr tablet Take 1 tablet (300 mg total) by mouth daily. 30 tablet 5  . clonazePAM (KLONOPIN) 1 MG tablet 1/2-1 po BID prn anxiety 30 tablet 3  . doxycycline (VIBRAMYCIN) 100 MG capsule Take 1 capsule (100 mg total) by mouth 2 (two) times daily. Take BID for 14 days.  Take with food as can cause GI distress. 28 capsule 0  . megestrol (MEGACE) 40 MG tablet Take 1 tablet (40 mg total) by mouth 2 (two) times daily.  60 tablet 0  . meloxicam (MOBIC) 15 MG tablet Take 1 tablet by mouth daily.     No current facility-administered medications for this visit.      ALLERGIES: Zoloft [sertraline hcl]  Family History  Problem Relation Age of Onset  . Dementia Father   . Bipolar disorder Sister   . Cancer Maternal Grandmother        breast  . Stroke Maternal Grandfather   . Alzheimer's disease Paternal Grandmother   . Heart attack Paternal Grandfather     Social History   Socioeconomic History  . Marital status: Divorced    Spouse name: Not on file  . Number of children: Not on file  . Years of education: Not on file  . Highest education level: Not on file  Occupational History  . Not on file  Social Needs  . Financial resource strain: Not on file  . Food insecurity:    Worry: Not on file     Inability: Not on file  . Transportation needs:    Medical: Not on file    Non-medical: Not on file  Tobacco Use  . Smoking status: Current Every Day Smoker    Packs/day: 1.00  . Smokeless tobacco: Never Used  Substance and Sexual Activity  . Alcohol use: Yes    Comment: occasionally  . Drug use: Never  . Sexual activity: Yes    Birth control/protection: None, Surgical  Lifestyle  . Physical activity:    Days per week: Not on file    Minutes per session: Not on file  . Stress: Not on file  Relationships  . Social connections:    Talks on phone: Not on file    Gets together: Not on file    Attends religious service: Not on file    Active member of club or organization: Not on file    Attends meetings of clubs or organizations: Not on file    Relationship status: Not on file  . Intimate partner violence:    Fear of current or ex partner: Not on file    Emotionally abused: Not on file    Physically abused: Not on file    Forced sexual activity: Not on file  Other Topics Concern  . Not on file  Social History Narrative  . Not on file    Review of Systems  Genitourinary: Positive for dysuria.  All other systems reviewed and are negative.   PHYSICAL EXAMINATION:    BP 110/72 (BP Location: Right Arm, Patient Position: Sitting, Cuff Size: Normal)   Pulse 70   Temp 98.1 F (36.7 C) (Oral)   Ht 5' 3.75" (1.619 m)   Wt 135 lb 12.8 oz (61.6 kg)   LMP 05/03/2018 (Exact Date) Comment: spotting  BMI 23.49 kg/m     General appearance: alert, cooperative and appears stated age.  Looks tired.  Pelvic: External genitalia:  no lesions              Urethra:  normal appearing urethra with no masses, tenderness or lesions              Bartholins and Skenes: normal                 Vagina: normal appearing vagina with normal color and discharge, no lesions              Cervix: no lesions.  No CMT.  Bimanual Exam:  Uterus:   10 week size and bulky.  Tender over  bladder.               Adnexa:  Left adnexal fullness and tenderness.               Chaperone was present for exam.  ASSESSMENT  Pelvic pain.  Under treatment for presumed PID but now with negative GC/CT. Fibroids.  Left adnexal mass consistent with hydrosalpinx. Status post right oophorectomy for dermoid.  Status post BTL.  Smoker over age 21.   PLAN  Continue abx.  Will order pelvic US to rule out infarcted fibroid or enlarging adnexal mass. No Mirena IUD at this time. CBC with diff, CMP.  She has Meloxicam for pain  I told patient I can sign intermittent FMLA for this week.  She will give me papers to fill out.  I recommend she keep her follow up with Dr. Matilde Sprang to rule out urologic etiology of her pain.    An After Visit Summary was printed and given to the patient.  _25_____ minutes face to face time of which over 50% was spent in counseling.

## 2018-05-11 ENCOUNTER — Other Ambulatory Visit: Payer: Self-pay | Admitting: *Deleted

## 2018-05-11 DIAGNOSIS — R102 Pelvic and perineal pain: Secondary | ICD-10-CM

## 2018-05-11 LAB — CBC WITH DIFFERENTIAL/PLATELET
Basophils Absolute: 0.1 10*3/uL (ref 0.0–0.2)
Basos: 1 %
EOS (ABSOLUTE): 0.5 10*3/uL — ABNORMAL HIGH (ref 0.0–0.4)
Eos: 6 %
HEMATOCRIT: 39.5 % (ref 34.0–46.6)
Hemoglobin: 12.9 g/dL (ref 11.1–15.9)
Immature Grans (Abs): 0.1 10*3/uL (ref 0.0–0.1)
Immature Granulocytes: 1 %
Lymphocytes Absolute: 3 10*3/uL (ref 0.7–3.1)
Lymphs: 36 %
MCH: 29.9 pg (ref 26.6–33.0)
MCHC: 32.7 g/dL (ref 31.5–35.7)
MCV: 91 fL (ref 79–97)
Monocytes Absolute: 0.7 10*3/uL (ref 0.1–0.9)
Monocytes: 8 %
NEUTROS ABS: 4 10*3/uL (ref 1.4–7.0)
Neutrophils: 48 %
Platelets: 315 10*3/uL (ref 150–450)
RBC: 4.32 x10E6/uL (ref 3.77–5.28)
RDW: 13.4 % (ref 11.7–15.4)
WBC: 8.3 10*3/uL (ref 3.4–10.8)

## 2018-05-11 LAB — COMPREHENSIVE METABOLIC PANEL
A/G RATIO: 2.7 — AB (ref 1.2–2.2)
ALT: 16 IU/L (ref 0–32)
AST: 13 IU/L (ref 0–40)
Albumin: 4.8 g/dL (ref 3.8–4.8)
Alkaline Phosphatase: 55 IU/L (ref 39–117)
BUN/Creatinine Ratio: 21 (ref 9–23)
BUN: 16 mg/dL (ref 6–24)
Bilirubin Total: 0.2 mg/dL (ref 0.0–1.2)
CO2: 19 mmol/L — ABNORMAL LOW (ref 20–29)
Calcium: 9.6 mg/dL (ref 8.7–10.2)
Chloride: 105 mmol/L (ref 96–106)
Creatinine, Ser: 0.78 mg/dL (ref 0.57–1.00)
GFR calc Af Amer: 107 mL/min/{1.73_m2} (ref >59)
GFR calc non Af Amer: 93 mL/min/{1.73_m2} (ref >59)
GLOBULIN, TOTAL: 1.8 g/dL (ref 1.5–4.5)
Glucose: 82 mg/dL (ref 65–99)
Potassium: 4.2 mmol/L (ref 3.5–5.2)
SODIUM: 141 mmol/L (ref 134–144)
Total Protein: 6.6 g/dL (ref 6.0–8.5)

## 2018-05-11 MED ORDER — METRONIDAZOLE 500 MG PO TABS: 500.0000 mg | ORAL_TABLET | Freq: Two times a day (BID) | ORAL | 0 refills | Status: DC

## 2018-05-12 ENCOUNTER — Ambulatory Visit (HOSPITAL_COMMUNITY): Payer: 59

## 2018-05-12 ENCOUNTER — Encounter (HOSPITAL_COMMUNITY): Payer: Self-pay

## 2018-05-13 ENCOUNTER — Ambulatory Visit (INDEPENDENT_AMBULATORY_CARE_PROVIDER_SITE_OTHER): Payer: 59

## 2018-05-13 ENCOUNTER — Encounter: Payer: Self-pay | Admitting: Obstetrics and Gynecology

## 2018-05-13 ENCOUNTER — Other Ambulatory Visit: Payer: Self-pay

## 2018-05-13 ENCOUNTER — Ambulatory Visit (INDEPENDENT_AMBULATORY_CARE_PROVIDER_SITE_OTHER): Payer: 59 | Admitting: Obstetrics and Gynecology

## 2018-05-13 VITALS — BP 108/74 | HR 76 | Ht 63.75 in | Wt 136.2 lb

## 2018-05-13 DIAGNOSIS — R102 Pelvic and perineal pain: Secondary | ICD-10-CM | POA: Diagnosis not present

## 2018-05-13 DIAGNOSIS — R3915 Urgency of urination: Secondary | ICD-10-CM | POA: Diagnosis not present

## 2018-05-13 DIAGNOSIS — Z0289 Encounter for other administrative examinations: Secondary | ICD-10-CM

## 2018-05-13 MED ORDER — MEGESTROL ACETATE 40 MG PO TABS: 40.0000 mg | ORAL_TABLET | Freq: Two times a day (BID) | ORAL | 0 refills | Status: DC

## 2018-05-13 NOTE — Progress Notes (Signed)
GYNECOLOGY  VISIT   HPI: 43 y.o.   Divorced  Caucasian  female   [phone] with Patient's last menstrual period was 05/03/2018 (exact date).   here for pelvic ultrasound.   Back on Megace for abut 3 weeks. Having some cramping.   Used a vaginal lubricant which she thinks caused all of her pain to begin.  The Megace was causing dryness symptoms.  Still having urgency.  Feels heaviness in the pelvis, but it is improved.   Has Rx for Flagyl due to BV noted on vaginitis/cervicitis panel done.   Interested in the [place] IUD.   Going on a cruise on 05/22/18.  Returns a week later.   She is scheduled to return to work in 4 days.   GYNECOLOGIC HISTORY: Patient's last menstrual period was 05/03/2018 (exact date). Contraception: Tubal Menopausal hormone therapy:  none Last mammogram: 09/01/17 Diag.Bil.&US Right BIRADS--Multiple benign cysts within each breast/Neg/screening 44yr./BiRads2 Last pap smear: 12-16-17 ASCUS:Neg, 08-12-13 Neg:Neg HR HPV        OB History    Gravida  4   Para  3   Term  0   Preterm  0   AB  1   Living  3     SAB  1   TAB  0   Ectopic  0   Multiple  0   Live Births  0              Patient Active Problem List   Diagnosis Date Noted  . Bilateral breast cysts 04/08/2018  . Depression, major, single episode, moderate ([place]) 01/23/2018  . Generalized anxiety disorder 01/23/2018  . Adnexal cyst   . Female bladder prolapse 12/19/2017  . Genuine stress incontinence, female 12/19/2017  . Anxiety as acute reaction to exceptional stress 02/05/2015  . Mixed incontinence urge and stress 09/01/2013  . Dyspareunia 09/01/2013  . Menorrhagia 08/12/2013  . Dysmenorrhea 08/12/2013  . Back pain 01/06/2011  . Neck pain 01/06/2011  . NICOTINE ADDICTION 12/28/2008  . FATIGUE 12/28/2008  . UNSPECIFIED URINARY INCONTINENCE 12/28/2008    Past Medical History:  Diagnosis Date  . Anxiety   . Breast nodule 08/12/2013  . Depression   . Dysmenorrhea  08/12/2013  . Fibroid   . Menorrhagia 08/12/2013  . Ovarian cyst    left - 68 x 36 x 22 mm, complex, CA125 40  . UI (urinary incontinence) 08/12/2013    Past Surgical History:  Procedure Laterality Date  . APPENDECTOMY    . BREAST CYST EXCISION    . CESAREAN SECTION    . OOPHORECTOMY Right   . TUBAL LIGATION      Current Outpatient Medications  Medication Sig Dispense Refill  . buPROPion (WELLBUTRIN XL) 150 MG 24 hr tablet Take one daily 30 tablet 5  . buPROPion (WELLBUTRIN XL) 300 MG 24 hr tablet Take 1 tablet (300 mg total) by mouth daily. 30 tablet 5  . clonazePAM (KLONOPIN) 1 MG tablet 1/2-1 po BID prn anxiety 30 tablet 3  . doxycycline (VIBRAMYCIN) 100 MG capsule Take 1 capsule (100 mg total) by mouth 2 (two) times daily. Take BID for 14 days.  Take with food as can cause GI distress. 28 capsule 0  . megestrol (MEGACE) 40 MG tablet Take 1 tablet (40 mg total) by mouth 2 (two) times daily. 60 tablet 0  . meloxicam (MOBIC) 15 MG tablet Take 1 tablet by mouth daily.    . metroNIDAZOLE (FLAGYL) 500 MG tablet Take 1 tablet (500 mg total)  by mouth 2 (two) times daily. 14 tablet 0   No current facility-administered medications for this visit.      ALLERGIES: Zoloft [sertraline hcl]  Family History  Problem Relation Age of Onset  . Dementia Father   . Bipolar disorder Sister   . Cancer Maternal Grandmother        breast  . Stroke Maternal Grandfather   . Alzheimer's disease Paternal Grandmother   . Heart attack Paternal Grandfather     Social History   Socioeconomic History  . Marital status: Divorced    Spouse name: Not on file  . Number of children: Not on file  . Years of education: Not on file  . Highest education level: Not on file  Occupational History  . Not on file  Social Needs  . Financial resource strain: Not on file  . Food insecurity:    Worry: Not on file    Inability: Not on file  . Transportation needs:    Medical: Not on file    Non-medical: Not  on file  Tobacco Use  . Smoking status: Current Every Day Smoker    Packs/day: 1.00  . Smokeless tobacco: Never Used  Substance and Sexual Activity  . Alcohol use: Yes    Comment: occasionally  . Drug use: Never  . Sexual activity: Yes    Birth control/protection: None, Surgical  Lifestyle  . Physical activity:    Days per week: Not on file    Minutes per session: Not on file  . Stress: Not on file  Relationships  . Social connections:    Talks on phone: Not on file    Gets together: Not on file    Attends religious service: Not on file    Active member of club or organization: Not on file    Attends meetings of clubs or organizations: Not on file    Relationship status: Not on file  . Intimate partner violence:    Fear of current or ex partner: Not on file    Emotionally abused: Not on file    Physically abused: Not on file    Forced sexual activity: Not on file  Other Topics Concern  . Not on file  Social History Narrative  . Not on file    Review of Systems  All other systems reviewed and are negative.   PHYSICAL EXAMINATION:    BP 108/74 (BP Location: Right Arm, Patient Position: Sitting, Cuff Size: Normal)   Pulse 76   Ht 5' 3.75" (1.619 m)   Wt 136 lb 3.2 oz (61.8 kg)   LMP 05/03/2018 (Exact Date) Comment: spotting  BMI 23.56 kg/m     General appearance: alert, cooperative and appears stated age   Pelvic US Fibroids noted - stable.  EMS 3.75 mm.  Right ovary absent.  Left ovary normal.  Left adnexal cystic structure 45 x 22 mm with 4 mm thickened wall. No abnormal blood flow. No free fluid.  ASSESSMENT  Pelvic pressure and urinary urgency.  Symptoms flared after discontinuation of Megace.  I suspect patient may have a component of endometriosis and adenomyosis to her clinical picture. Fibroids and left hydrosalpinx. Bacterial vaginosis.  Current tx for presumed PID but GC/CT are negative.  Tobacco use.  Status post BTL.  PLAN  We reviewed  her ultrasound and pelvic anatomy and talked about potential endometriosis as part of her clinical picture.  She will finish all of her abx.  I encouraged her to follow up with  Dr. Matilde Sprang if she is still having urinary symptoms.  She will have her Mirena IUD placed in about 3 weeks when she returns from traveling. I refilled her Megace so she can continue this until then.  She is not a candidate for combined contraception as she is smoking.  Questions invited and answered.   An After Visit Summary was printed and given to the patient.  __25____ minutes face to face time of which over 50% was spent in counseling.

## 2018-05-13 NOTE — Progress Notes (Signed)
Encounter reviewed by Dr. Jaaron Oleson Amundson C. Silva.  

## 2018-05-14 DIAGNOSIS — R351 Nocturia: Secondary | ICD-10-CM | POA: Diagnosis not present

## 2018-05-14 DIAGNOSIS — N390 Urinary tract infection, site not specified: Secondary | ICD-10-CM | POA: Diagnosis not present

## 2018-05-20 MED FILL — MEGESTROL 40 MG TABLET: 40 | 30 days supply | Qty: 60 | Fill #0

## 2018-05-29 MED FILL — buPROPion HCL ER (XL) 300 M: 300 | 30 days supply | Qty: 30 | Fill #3 | Status: TO

## 2018-06-01 ENCOUNTER — Encounter: Payer: Self-pay | Admitting: Obstetrics and Gynecology

## 2018-06-02 ENCOUNTER — Telehealth: Payer: Self-pay | Admitting: Obstetrics and Gynecology

## 2018-06-02 NOTE — Telephone Encounter (Signed)
Patient is returning a call to [name]. °

## 2018-06-02 NOTE — Telephone Encounter (Signed)
Spoke with patient. Describes internal burning in pelvic area that is constant. Started again Monday night, worse on 3/10. Denies fever/chills, N/V, pain, or any other GYN or urinary symptoms. Seen by Urology x2 since last OV 2/20, CT scan and "scope" completed. Recommended patient f/u with Urology regarding symptoms, if pain is severe, recommended cancelling IUD insertion for now, reschedule at later date once symptoms resolved. Patient states she does not want to hold off IUD any longer, would like to return call with update on 3/12 before cancelling IUD insertion. Advised I will update Dr. Quincy Simmonds and return call if any additional recommendations. Patient agreeable.   Routing to Dr. Antony Blackbird

## 2018-06-02 NOTE — Telephone Encounter (Signed)
Left message to call Sharee Pimple, RN at [facility].   MyChart message.

## 2018-06-02 NOTE — Telephone Encounter (Signed)
Patient sent the following correspondence through [facility]. Routing to triage to assist patient with request. I called the patient and left a message to call our office for an appointment.  Message:  I have an appointment for Friday 3/13 for IUD placement but my urine has began burning again yesterday. I have finished all antibiotics and did have some relief for 2 weeks. Does this mean I will need another antibiotic and the IUD will not be placed in on Friday?  Routing to triage for FYI.

## 2018-06-03 NOTE — Telephone Encounter (Signed)
Encounter reviewed and closed by me.

## 2018-06-04 ENCOUNTER — Ambulatory Visit: Payer: 59 | Admitting: Obstetrics and Gynecology

## 2018-06-04 ENCOUNTER — Encounter: Payer: Self-pay | Admitting: Obstetrics and Gynecology

## 2018-06-04 ENCOUNTER — Other Ambulatory Visit: Payer: Self-pay

## 2018-06-04 VITALS — BP 102/60 | HR 84 | Resp 14 | Ht 63.75 in | Wt 135.0 lb

## 2018-06-04 DIAGNOSIS — Z01812 Encounter for preprocedural laboratory examination: Secondary | ICD-10-CM | POA: Diagnosis not present

## 2018-06-04 DIAGNOSIS — N939 Abnormal uterine and vaginal bleeding, unspecified: Secondary | ICD-10-CM

## 2018-06-04 LAB — POCT URINE PREGNANCY: Preg Test, Ur: NEGATIVE

## 2018-06-04 NOTE — Progress Notes (Signed)
GYNECOLOGY  VISIT   HPI: 43 y.o.   Divorced  Caucasian  female   [phone] with Patient's last menstrual period was 10/29/2017.   here for Mirena IUD insertion     States her pelvic pain is better.   Saw Dr. Matilde Sprang and had cystoscopy.  He found a polyp but it was not removed yet.  Her bladder burning is better.  Notes that caffeine makes the bladder worse.   Is back on Megace.  Feeling better now on this.  Has gained 15 pounds.   GYNECOLOGIC HISTORY: Patient's last menstrual period was 10/29/2017. Contraception:  Tubal ligation Menopausal hormone therapy:  none Last mammogram:  09/01/17 Diag.Bil.&US Right BIRADS--Multiple benign cysts within each breast/Neg/screening 17yr./BiRads2 Last pap smear:   12/16/17 ASCUS:NEG HR HPV; 08/12/13 Neg:Neg HR HPV        OB History    Gravida  4   Para  3   Term  0   Preterm  0   AB  1   Living  3     SAB  1   TAB  0   Ectopic  0   Multiple  0   Live Births  0              Patient Active Problem List   Diagnosis Date Noted  . Bilateral breast cysts 04/08/2018  . Depression, major, single episode, moderate ([place]) 01/23/2018  . Generalized anxiety disorder 01/23/2018  . Adnexal cyst   . Female bladder prolapse 12/19/2017  . Genuine stress incontinence, female 12/19/2017  . Anxiety as acute reaction to exceptional stress 02/05/2015  . Mixed incontinence urge and stress 09/01/2013  . Dyspareunia 09/01/2013  . Menorrhagia 08/12/2013  . Dysmenorrhea 08/12/2013  . Back pain 01/06/2011  . Neck pain 01/06/2011  . NICOTINE ADDICTION 12/28/2008  . FATIGUE 12/28/2008  . UNSPECIFIED URINARY INCONTINENCE 12/28/2008    Past Medical History:  Diagnosis Date  . Anxiety   . Breast nodule 08/12/2013  . Depression   . Dysmenorrhea 08/12/2013  . Fibroid   . Menorrhagia 08/12/2013  . Ovarian cyst    left - 68 x 36 x 22 mm, complex, CA125 40  . UI (urinary incontinence) 08/12/2013    Past Surgical History:  Procedure  Laterality Date  . APPENDECTOMY    . BREAST CYST EXCISION    . CESAREAN SECTION    . OOPHORECTOMY Right   . TUBAL LIGATION      Current Outpatient Medications  Medication Sig Dispense Refill  . buPROPion (WELLBUTRIN XL) 150 MG 24 hr tablet Take one daily 30 tablet 5  . buPROPion (WELLBUTRIN XL) 300 MG 24 hr tablet Take 1 tablet (300 mg total) by mouth daily. 30 tablet 5  . clonazePAM (KLONOPIN) 1 MG tablet 1/2-1 po BID prn anxiety 30 tablet 3  . megestrol (MEGACE) 40 MG tablet Take 1 tablet (40 mg total) by mouth 2 (two) times daily. 60 tablet 0   No current facility-administered medications for this visit.      ALLERGIES: Zoloft [sertraline hcl]  Family History  Problem Relation Age of Onset  . Dementia Father   . Bipolar disorder Sister   . Cancer Maternal Grandmother        breast  . Stroke Maternal Grandfather   . Alzheimer's disease Paternal Grandmother   . Heart attack Paternal Grandfather     Social History   Socioeconomic History  . Marital status: Divorced    Spouse name: Not on file  .  Number of children: Not on file  . Years of education: Not on file  . Highest education level: Not on file  Occupational History  . Not on file  Social Needs  . Financial resource strain: Not on file  . Food insecurity:    Worry: Not on file    Inability: Not on file  . Transportation needs:    Medical: Not on file    Non-medical: Not on file  Tobacco Use  . Smoking status: Current Every Day Smoker    Packs/day: 1.00  . Smokeless tobacco: Never Used  Substance and Sexual Activity  . Alcohol use: Yes    Comment: occasionally  . Drug use: Never  . Sexual activity: Yes    Birth control/protection: None, Surgical  Lifestyle  . Physical activity:    Days per week: Not on file    Minutes per session: Not on file  . Stress: Not on file  Relationships  . Social connections:    Talks on phone: Not on file    Gets together: Not on file    Attends religious service:  Not on file    Active member of club or organization: Not on file    Attends meetings of clubs or organizations: Not on file    Relationship status: Not on file  . Intimate partner violence:    Fear of current or ex partner: Not on file    Emotionally abused: Not on file    Physically abused: Not on file    Forced sexual activity: Not on file  Other Topics Concern  . Not on file  Social History Narrative  . Not on file    Review of Systems  Constitutional: Negative.   HENT: Negative.   Eyes: Negative.   Respiratory: Negative.   Cardiovascular: Negative.   Gastrointestinal: Negative.   Endocrine: Negative.   Genitourinary: Negative.   Musculoskeletal: Negative.   Skin: Negative.   Allergic/Immunologic: Negative.   Neurological: Negative.   Hematological: Negative.   Psychiatric/Behavioral: Negative.     PHYSICAL EXAMINATION:    BP 102/60 (BP Location: Right Arm, Patient Position: Sitting, Cuff Size: Large)   Pulse 84   Resp 14   Ht 5' 3.75" (1.619 m)   Wt 135 lb (61.2 kg)   LMP 10/29/2017   BMI 23.35 kg/m     General appearance: alert, cooperative and appears stated age    Pelvic: External genitalia:  no lesions              Urethra:  normal appearing urethra with no masses, tenderness or lesions              Bartholins and Skenes: normal                 Vagina: normal appearing vagina with normal color and discharge, no lesions              Cervix: no lesions                Bimanual Exam:  Uterus:  10 week size, posterior fibroid versus fundus.               Adnexa: no mass, fullness, tenderness         Mirena IUD placement attempt Consent for procedure.  Speculum placed.  Sterile prep with Hibiclens.  Paracervical block with 10 cc 1% lidocaine.  Os finder use.  Uterine sound passed to 6 cm, but obstruction felt.  Procedure abandoned.  Silver  nitrate used on tenaculum site.  Minimal EBL. No complications.   Chaperone was present for  exam.  ASSESSMENT  Unable to place Mirena IUD.  Abnormal uterine bleeding controlled on Megace.   Fibroids.  Pelvic pain.  I suspect she has endometriosis and adenomyosis.  Left hydrosalpinx.  Status post BTL. Tobacco use.   PLAN  We discussed alternatives including Depo Lupron, Aygestin, continued Megace, and hysterectomy.  She prefers to use Megace.  OK to continue Megace 40 mg po bid until annual exam is due in September, 2020.    An After Visit Summary was printed and given to the patient.  __25____ minutes face to face time of which over 50% was spent in counseling.

## 2018-06-11 ENCOUNTER — Ambulatory Visit: Payer: 59 | Admitting: Family Medicine

## 2018-06-15 MED FILL — buPROPion HCL ER (XL) 150 M: 150 | 30 days supply | Qty: 30 | Fill #3

## 2018-06-21 ENCOUNTER — Other Ambulatory Visit: Payer: Self-pay | Admitting: Obstetrics and Gynecology

## 2018-06-22 MED ORDER — MEGESTROL ACETATE 40 MG PO TABS: 40.0000 mg | ORAL_TABLET | Freq: Two times a day (BID) | ORAL | 1 refills | Status: DC

## 2018-06-24 MED FILL — buPROPion HCL ER (XL) 300 M: 300 | 30 days supply | Qty: 30 | Fill #0

## 2018-06-24 MED FILL — clonazePAM 1 MG TABS: 1 | 15 days supply | Qty: 30 | Fill #0

## 2018-06-28 MED FILL — MEGESTROL 40 MG TABLET: 40 | 90 days supply | Qty: 180 | Fill #0

## 2018-09-22 DIAGNOSIS — U071 COVID-19: Secondary | ICD-10-CM

## 2018-09-22 HISTORY — DX: COVID-19: U07.1

## 2018-09-27 ENCOUNTER — Other Ambulatory Visit: Payer: Self-pay

## 2018-09-27 ENCOUNTER — Other Ambulatory Visit: Payer: 59

## 2018-09-27 DIAGNOSIS — Z20822 Contact with and (suspected) exposure to covid-19: Secondary | ICD-10-CM

## 2018-10-15 MED FILL — MEGESTROL 40 MG TABLET: 40 | 90 days supply | Qty: 180 | Fill #1

## 2018-11-12 ENCOUNTER — Telehealth: Payer: Self-pay | Admitting: Obstetrics and Gynecology

## 2018-11-12 NOTE — Telephone Encounter (Signed)
Left message on voicemail to call and reschedule cancelled appointment. °

## 2018-12-08 ENCOUNTER — Ambulatory Visit: Payer: 59 | Admitting: Obstetrics and Gynecology

## 2018-12-10 ENCOUNTER — Ambulatory Visit: Payer: 59 | Admitting: Obstetrics and Gynecology

## 2019-01-25 ENCOUNTER — Telehealth: Payer: Self-pay | Admitting: Obstetrics and Gynecology

## 2019-01-25 NOTE — Telephone Encounter (Signed)
Appointment Request From: Anna Gonzalez    With Provider: Arloa Koh, MD [facility]]    Preferred Date Range: Any    Preferred Times: Thursday Morning, Thursday Afternoon, Friday Morning, Friday Afternoon    Reason for visit: Office Visit    Comments:  to schedule hysterotomy

## 2019-01-26 NOTE — Telephone Encounter (Signed)
Left message to call Sharee Pimple, RN at [facility].    Last PAP 12/16/17 -per review of 06/04/18 OV notes, AEX due 11/2018

## 2019-01-28 NOTE — Telephone Encounter (Signed)
Spoke with patient. Patient denies any new GYN concerns, would like to further discuss surgery planning. Last seen in office on 06/04/18 for consult. Patient due for AEX 11/2018.   Recommended patient proceed with AEX, can further discuss surgical planning once AEX updated, patient agreeable.   AEX scheduled for 02/02/19 at 1pm with Dr. Quincy Simmonds.   Routing to provider for final review. Patient is agreeable to disposition. Will close encounter.

## 2019-01-28 NOTE — Telephone Encounter (Signed)
Patient returning call to [name]. °

## 2019-02-02 ENCOUNTER — Other Ambulatory Visit (HOSPITAL_COMMUNITY)
Admission: RE | Admit: 2019-02-02 | Discharge: 2019-02-02 | Disposition: A | Discharge status: Home / Self Care | Payer: 59 | Source: Ambulatory Visit | Attending: Obstetrics and Gynecology | Admitting: Obstetrics and Gynecology

## 2019-02-02 ENCOUNTER — Ambulatory Visit (INDEPENDENT_AMBULATORY_CARE_PROVIDER_SITE_OTHER): Payer: 59 | Admitting: Obstetrics and Gynecology

## 2019-02-02 ENCOUNTER — Other Ambulatory Visit: Payer: Self-pay

## 2019-02-02 ENCOUNTER — Encounter: Payer: Self-pay | Admitting: Obstetrics and Gynecology

## 2019-02-02 VITALS — BP 130/64 | HR 80 | Temp 97.7°F | Resp 18 | Ht 62.75 in | Wt 136.6 lb

## 2019-02-02 DIAGNOSIS — Z01419 Encounter for gynecological examination (general) (routine) without abnormal findings: Secondary | ICD-10-CM | POA: Insufficient documentation

## 2019-02-02 DIAGNOSIS — N7011 Chronic salpingitis: Secondary | ICD-10-CM | POA: Insufficient documentation

## 2019-02-02 DIAGNOSIS — D219 Benign neoplasm of connective and other soft tissue, unspecified: Secondary | ICD-10-CM | POA: Diagnosis not present

## 2019-02-02 NOTE — Progress Notes (Signed)
43 y.o. G90P0013 Divorced Caucasian female here for annual exam.    Had Covid in July, which felt like the flu.   Interested in proceeding with total laparoscopic hysterectomy and left salpingecotmy with possible left oophorectomy with pelvic washings for symptomatic fibroids, complex left ovarian cyst, thought to be most consistent with a left hydrosalpinx.  Her uterus is 12 week size.  EMB benign.  CA125 is 40 on 01/01/18.  She declines surgery for her stress incontinence confirmed by urodynamic testing.   She denies current vaginal bleeding.  Feels bloated some days and some cramping, but nothing major.  She is still on Megace, but taking 40 mg once a day.  She still has plenty of this.   Declines STD testing.  Same partner.   She was seen by urology for bladder pain.  Told she may have IC.   Working as a Chartered certified accountant at [employer].   PCP: Baltazar Apo, MD    Patient's last menstrual period was 10/29/2017 (exact date).           Sexually active: Yes.    The current method of family planning is Tubal   Exercising: Yes.    walking Smoker:  Yes, 1ppd  Health Maintenance: Pap:  12/16/17 ASCUS:NEG HR HPV; 08/12/13 Neg:Neg HR HPV History of abnormal Pap:  no MMG: 09-01-17 Diag.Bil.w/Bil US--Multiple benign cysts within each breast./density C/Neg/BiRads2/screening 27yr. Colonoscopy:  no BMD:   n/a  Result  n/a TDaP: up to date with work Gardasil:   no HIV: Neg with Preg Hep C:Unsure Screening Labs:  Today Flu vaccine:  Completed.   reports that she has been smoking. She has been smoking about 1.00 pack per day. She has never used smokeless tobacco. She reports current alcohol use of about 2.0 standard drinks of alcohol per week. She reports that she does not use drugs.  Past Medical History:  Diagnosis Date  . Anxiety   . Breast nodule 08/12/2013  . COVID-19 09/2018  . Depression   . Dysmenorrhea 08/12/2013  . Fibroid   . Menorrhagia 08/12/2013  . Ovarian cyst    left -  68 x 36 x 22 mm, complex, CA125 40  . UI (urinary incontinence) 08/12/2013    Past Surgical History:  Procedure Laterality Date  . APPENDECTOMY    . BREAST CYST EXCISION    . CESAREAN SECTION    . OOPHORECTOMY Right   . TUBAL LIGATION      Current Outpatient Medications  Medication Sig Dispense Refill  . clonazePAM (KLONOPIN) 1 MG tablet 1/2-1 po BID prn anxiety 30 tablet 3  . megestrol (MEGACE) 40 MG tablet Take 1 tablet (40 mg total) by mouth 2 (two) times daily. 180 tablet 1   No current facility-administered medications for this visit.     Family History  Problem Relation Age of Onset  . Dementia Father   . Bipolar disorder Sister   . Cancer Maternal Grandmother        breast  . Stroke Maternal Grandfather   . Alzheimer's disease Paternal Grandmother   . Heart attack Paternal Grandfather     Review of Systems  All other systems reviewed and are negative.   Exam:   BP 130/64   Pulse 80   Temp 97.7 F (36.5 C) (Temporal)   Resp 18   Ht 5' 2.75" (1.594 m)   Wt 136 lb 9.6 oz (62 kg)   LMP 10/29/2017 (Exact Date)   BMI 24.39 kg/m  General appearance: alert, cooperative and appears stated age Head: normocephalic, without obvious abnormality, atraumatic Neck: no adenopathy, supple, symmetrical, trachea midline and thyroid normal to inspection and palpation Lungs: clear to auscultation bilaterally Breasts: normal appearance, no masses or tenderness, No nipple retraction or dimpling, No nipple discharge or bleeding, No axillary adenopathy Heart: regular rate and rhythm Abdomen: soft, non-tender; no masses, no organomegaly Extremities: extremities normal, atraumatic, no cyanosis or edema Skin: skin color, texture, turgor normal. No rashes or lesions Lymph nodes: cervical, supraclavicular, and axillary nodes normal. Neurologic: grossly normal  Pelvic: External genitalia:  no lesions              No abnormal inguinal nodes palpated.              Urethra:  normal  appearing urethra with no masses, tenderness or lesions              Bartholins and Skenes: normal                 Vagina: normal appearing vagina with normal color and discharge, no lesions              Cervix: no lesions              Pap taken: Yes.   Bimanual Exam:  Uterus:  10 week size and non-er              Adnexa: no mass, fullness, tenderness on right.  Subtle fullness on left, nontender.              Rectal exam: Yes.  .  Confirms.              Anus:  normal sphincter tone, no lesions  Chaperone was present for exam.  Assessment:   Well woman visit with normal exam. Fibroids.  Abnormal uterine bleeding controlled on Megace.  Left hydrosalpinx.  Status post right oophorectomy for dermoid.  Status post BTL.  Smoker over age 80.   Plan: Mammogram screening discussed.  She will update this. Self breast awareness reviewed. Pap and HR HPV as above. Guidelines for Calcium, Vitamin D, regular exercise program including cardiovascular and weight bearing exercise. Routine labs.  Will proceed forward with total laparoscopic hysterectomy and left salpingecotmy with possible left oophorectomy with pelvic washings for symptomatic fibroids, complex left ovarian cyst, thought to be most consistent with a left hydrosalpinx.  Follow up annually and prn.   After visit summary provided.

## 2019-02-02 NOTE — Patient Instructions (Signed)

## 2019-02-03 LAB — COMPREHENSIVE METABOLIC PANEL
ALT: 21 IU/L (ref 0–32)
AST: 16 IU/L (ref 0–40)
Albumin/Globulin Ratio: 2.4 — ABNORMAL HIGH (ref 1.2–2.2)
Albumin: 5 g/dL — ABNORMAL HIGH (ref 3.8–4.8)
Alkaline Phosphatase: 62 IU/L (ref 39–117)
BUN/Creatinine Ratio: 19 (ref 9–23)
BUN: 15 mg/dL (ref 6–24)
Bilirubin Total: 0.3 mg/dL (ref 0.0–1.2)
CO2: 22 mmol/L (ref 20–29)
Calcium: 9.9 mg/dL (ref 8.7–10.2)
Chloride: 106 mmol/L (ref 96–106)
Creatinine, Ser: 0.78 mg/dL (ref 0.57–1.00)
GFR calc Af Amer: 106 mL/min/{1.73_m2} (ref >59)
GFR calc non Af Amer: 92 mL/min/{1.73_m2} (ref >59)
Globulin, Total: 2.1 g/dL (ref 1.5–4.5)
Glucose: 79 mg/dL (ref 65–99)
Potassium: 4.4 mmol/L (ref 3.5–5.2)
Sodium: 142 mmol/L (ref 134–144)
Total Protein: 7.1 g/dL (ref 6.0–8.5)

## 2019-02-03 LAB — LIPID PANEL
Chol/HDL Ratio: 5.3 ratio — ABNORMAL HIGH (ref 0.0–4.4)
Cholesterol, Total: 228 mg/dL — ABNORMAL HIGH (ref 100–199)
HDL: 43 mg/dL (ref >39)
LDL Chol Calc (NIH): 159 mg/dL — ABNORMAL HIGH (ref 0–99)
Triglycerides: 142 mg/dL (ref 0–149)
VLDL Cholesterol Cal: 26 mg/dL (ref 5–40)

## 2019-02-03 LAB — CBC
Hematocrit: 41 % (ref 34.0–46.6)
Hemoglobin: 13.6 g/dL (ref 11.1–15.9)
MCH: 31.1 pg (ref 26.6–33.0)
MCHC: 33.2 g/dL (ref 31.5–35.7)
MCV: 94 fL (ref 79–97)
Platelets: 271 10*3/uL (ref 150–450)
RBC: 4.38 x10E6/uL (ref 3.77–5.28)
RDW: 13 % (ref 11.7–15.4)
WBC: 6.3 10*3/uL (ref 3.4–10.8)

## 2019-02-04 LAB — CYTOLOGY - PAP
Comment: NEGATIVE
Diagnosis: NEGATIVE
High risk HPV: NEGATIVE

## 2019-04-11 ENCOUNTER — Telehealth: Payer: Self-pay | Admitting: Obstetrics and Gynecology

## 2019-04-11 NOTE — Telephone Encounter (Signed)
Call placed to patient to follow up and review benefits for surgery planning. Left voicemail message requesting a return call/   cc: Reesa Chew, RN

## 2019-04-25 ENCOUNTER — Encounter: Payer: Self-pay | Admitting: Family Medicine

## 2019-05-02 NOTE — Telephone Encounter (Signed)
Routing to [employer] for follow up with patient to discuss surgery benefits.

## 2019-05-06 NOTE — Telephone Encounter (Signed)
Spoke with patient. Patient would like surgery on 05/30/2019. Surgery planned for 05/30/2019 at [facility] 0730. Pre op appointment scheduled for 05/19/2019 at 3:30 pm. COVID test scheduled for 05/26/2019 at 3:10 om. 1 week post op scheduled for 06/07/2019 at 2 pm. 6 week post op scheduled for 07/11/2019 at 1 pm. Patient is agreeable to all dates and times. Surgery instructions reviewed and mailed to patient's verified home address.  Cc: Hayley Carder  Routing to provider and will close encounter.

## 2019-05-06 NOTE — Telephone Encounter (Signed)
Call to patient. Spoke with patient regarding surgery benefits. Patient acknowledges understanding of information presented. Patient is aware that benefits presented are for professional benefits only. Patient is aware that once surgery is scheduled, the hospital will call with separate benefits. Patient is aware of surgery cancellation policy. Patient is ready to proceed with scheduling surgery.

## 2019-05-15 ENCOUNTER — Telehealth: Payer: Self-pay | Admitting: Obstetrics and Gynecology

## 2019-05-15 NOTE — Telephone Encounter (Signed)
Please contact patient in preparation for her surgery.   She needs to update her mammogram before surgery.   Cc- Kaitlyn Sprague

## 2019-05-16 NOTE — Telephone Encounter (Signed)
Left message to call Ruth Tully at [phone]. 

## 2019-05-19 ENCOUNTER — Ambulatory Visit: Payer: 59 | Admitting: Obstetrics and Gynecology

## 2019-05-19 ENCOUNTER — Encounter: Payer: Self-pay | Admitting: Obstetrics and Gynecology

## 2019-05-19 ENCOUNTER — Other Ambulatory Visit: Payer: Self-pay

## 2019-05-19 VITALS — BP 104/70 | HR 72 | Temp 97.2°F | Ht 63.75 in | Wt 133.2 lb

## 2019-05-19 DIAGNOSIS — R102 Pelvic and perineal pain: Secondary | ICD-10-CM

## 2019-05-19 DIAGNOSIS — N7011 Chronic salpingitis: Secondary | ICD-10-CM

## 2019-05-19 DIAGNOSIS — D219 Benign neoplasm of connective and other soft tissue, unspecified: Secondary | ICD-10-CM

## 2019-05-19 DIAGNOSIS — N939 Abnormal uterine and vaginal bleeding, unspecified: Secondary | ICD-10-CM

## 2019-05-19 NOTE — Patient Instructions (Addendum)
Get your Covid test at [facility] in [place]. Go to the cement overhang at 3:10 pm. It is a drive thru.   Anna Gonzalez       Your procedure is scheduled on 05/30/19   Report to [facility]  at   5:30 A.M.   Call this number if you have problems the morning of surgery:[phone]   OUR ADDRESS IS [place], WE ARE LOCATED [facility] THE [facility] WITH [facility].   Remember:  Do not eat food or drink liquids after midnight.   Take these medicines the morning of surgery with A SIP OF WATER: none   Do not wear jewelry, make-up or nail polish.  Do not wear lotions, powders, or perfumes, or deoderant.  Do not shave 48 hours prior to surgery.    Do not bring valuables to the hospital.  [facility] is not responsible for any belongings or valuables.  Contacts, dentures or bridgework may not be worn into surgery.   For patients admitted to the hospital, discharge time will be determined by your treatment team.  Patients discharged the day of surgery will not be allowed to drive home.   Special instructions:  Bring meds in original bottles  Please read over the following fact sheets that you were given:       [facility] - Preparing for Surgery  Before surgery, you can play an important role.   Because skin is not sterile, your skin needs to be as free of germs as possible.   You can reduce the number of germs on your skin by washing with CHG (chlorahexidine gluconate) soap before surgery.   CHG is an antiseptic cleaner which kills germs and bonds with the skin to continue killing germs even after washing. Please DO NOT use if you have an allergy to CHG or antibacterial soaps.   If your skin becomes reddened/irritated stop using the CHG and inform your nurse when you arrive at Short Stay. Do not shave (including legs and underarms) for at least 48 hours prior to the first CHG shower.    Please follow these instructions carefully:  1.   Shower with CHG Soap the night before surgery and the  morning of Surgery.  2.  If you choose to wash your hair, wash your hair first as usual with your  normal  shampoo.  3.  After you shampoo, rinse your hair and body thoroughly to remove the  shampoo.                                        4.  Use CHG as you would any other liquid soap.  You can apply chg directly  to the skin and wash                       Gently with a scrungie or clean washcloth.  5.  Apply the CHG Soap to your body ONLY FROM THE NECK DOWN.   Do not use on face/ open                           Wound or open sores. Avoid contact with eyes, ears mouth and genitals (private parts).  Wash face,  Genitals (private parts) with your normal soap.             6.  Wash thoroughly, paying special attention to the area where your surgery  will be performed.  7.  Thoroughly rinse your body with warm water from the neck down.  8.  DO NOT shower/wash with your normal soap after using and rinsing off  the CHG Soap.             9.  Pat yourself dry with a clean towel.            10.  Wear clean pajamas.            11.  Place clean sheets on your bed the night of your first shower and do not  sleep with pets. Day of Surgery : Do not apply any lotions/deodorants the morning of surgery.  Please wear clean clothes to the hospital/surgery center.  FAILURE TO FOLLOW THESE INSTRUCTIONS MAY RESULT IN THE CANCELLATION OF YOUR SURGERY PATIENT SIGNATURE_________________________________  NURSE SIGNATURE__________________________________  ________________________________________________________________________

## 2019-05-19 NOTE — Progress Notes (Signed)
GYNECOLOGY  VISIT   HPI: 44 y.o.   Divorced  Caucasian  female   [phone] with Patient's last menstrual period was 10/30/2018 (approximate).   here for surgery consult.   Interested in proceeding with total laparoscopic hysterectomy and leftsalpingo-oophorectomy  with pelvic washings for symptomatic fibroids, complex left ovarian cyst, thought to be most consistent with a left hydrosalpinx.  She has had multiple ultrasounds.   She has 3 fibroids, the largest of which is 3.82 cm.  Her left adnexa contains a cystic structure 34 x 14 x 15 mm.  Her left ovary has follicles.  No free fluid is noted.  Her uterus is 10 week size on exam.  EMB benign.  CA125 is 40 on 01/01/18.  She stopped Megace about one month ago.  She is now having spotting every couple of days.  No fatigue.  One occurrence of cramping like starting a menstruation.   She has a history of strong pelvic pain and requests removal of her left ovary.   She declines future childbearing.   She declines surgery for her stress incontinence confirmed by urodynamic testing.  She no longer has burning with urination since stopping the Megace.   GYNECOLOGIC HISTORY: Patient's last menstrual period was 10/30/2018 (approximate). Contraception: Tubal  Menopausal hormone therapy:  None Last mammogram: 09-01-17 Diag.Bil.w/Bil US--Multiple benign cysts within each breast./density C/Neg/BiRads2/screening 16yr.  Last pap smear: 02-02-19 Neg:Neg HR HPV, 12/16/17 ASCUS:NEG HR HPV; 08/12/13 Neg:Neg HR HPV        OB History    Gravida  4   Para  3   Term  0   Preterm  0   AB  1   Living  3     SAB  1   TAB  0   Ectopic  0   Multiple  0   Live Births  0              Patient Active Problem List   Diagnosis Date Noted  . Fibroids 02/02/2019  . Hydrosalpinx 02/02/2019  . Bilateral breast cysts 04/08/2018  . Depression, major, single episode, moderate ([place]) 01/23/2018  . Generalized anxiety disorder 01/23/2018  .  Adnexal cyst   . Female bladder prolapse 12/19/2017  . Genuine stress incontinence, female 12/19/2017  . Anxiety as acute reaction to exceptional stress 02/05/2015  . Mixed incontinence urge and stress 09/01/2013  . Dyspareunia 09/01/2013  . Menorrhagia 08/12/2013  . Dysmenorrhea 08/12/2013  . Back pain 01/06/2011  . Neck pain 01/06/2011  . NICOTINE ADDICTION 12/28/2008  . FATIGUE 12/28/2008  . UNSPECIFIED URINARY INCONTINENCE 12/28/2008    Past Medical History:  Diagnosis Date  . Anxiety   . Breast nodule 08/12/2013  . COVID-19 09/2018  . Depression   . Dysmenorrhea 08/12/2013  . Fibroid   . Menorrhagia 08/12/2013  . Ovarian cyst    left - 68 x 36 x 22 mm, complex, CA125 40  . UI (urinary incontinence) 08/12/2013    Past Surgical History:  Procedure Laterality Date  . APPENDECTOMY    . BREAST CYST EXCISION    . CESAREAN SECTION    . OOPHORECTOMY Right   . TUBAL LIGATION      Current Outpatient Medications  Medication Sig Dispense Refill  . clonazePAM (KLONOPIN) 1 MG tablet 1/2-1 po BID prn anxiety 30 tablet 3   No current facility-administered medications for this visit.     ALLERGIES: Zoloft [sertraline hcl]  Family History  Problem Relation Age of Onset  . Dementia  Father   . Bipolar disorder Sister   . Cancer Maternal Grandmother        breast  . Stroke Maternal Grandfather   . Alzheimer's disease Paternal Grandmother   . Heart attack Paternal Grandfather     Social History   Socioeconomic History  . Marital status: Divorced    Spouse name: Not on file  . Number of children: Not on file  . Years of education: Not on file  . Highest education level: Not on file  Occupational History  . Not on file  Tobacco Use  . Smoking status: Current Every Day Smoker    Packs/day: 1.00  . Smokeless tobacco: Never Used  Substance and Sexual Activity  . Alcohol use: Yes    Alcohol/week: 2.0 standard drinks    Types: 2 Glasses of wine per week    Comment:  occasionally  . Drug use: Never  . Sexual activity: Yes    Birth control/protection: None, Surgical  Other Topics Concern  . Not on file  Social History Narrative  . Not on file   Social Determinants of Health   Financial Resource Strain:   . Difficulty of Paying Living Expenses: Not on file  Food Insecurity:   . Worried About Charity fundraiser in the Last Year: Not on file  . Ran Out of Food in the Last Year: Not on file  Transportation Needs:   . Lack of Transportation (Medical): Not on file  . Lack of Transportation (Non-Medical): Not on file  Physical Activity:   . Days of Exercise per Week: Not on file  . Minutes of Exercise per Session: Not on file  Stress:   . Feeling of Stress : Not on file  Social Connections:   . Frequency of Communication with Friends and Family: Not on file  . Frequency of Social Gatherings with Friends and Family: Not on file  . Attends Religious Services: Not on file  . Active Member of Clubs or Organizations: Not on file  . Attends Archivist Meetings: Not on file  . Marital Status: Not on file  Intimate Partner Violence:   . Fear of Current or Ex-Partner: Not on file  . Emotionally Abused: Not on file  . Physically Abused: Not on file  . Sexually Abused: Not on file    Review of Systems  All other systems reviewed and are negative.   PHYSICAL EXAMINATION:    BP 104/70   Pulse 72   Temp (!) 97.2 F (36.2 C) (Temporal)   Ht 5' 3.75" (1.619 m)   Wt 133 lb 3.2 oz (60.4 kg)   LMP 10/30/2018 (Approximate)   BMI 23.04 kg/m     General appearance: alert, cooperative and appears stated age Head: Normocephalic, without obvious abnormality, atraumatic Neck: no adenopathy, supple, symmetrical, trachea midline and thyroid normal to inspection and palpation Lungs: clear to auscultation bilaterally Heart: regular rate and rhythm Abdomen: soft, non-tender, no masses,  no organomegaly Extremities: extremities normal, atraumatic,  no cyanosis or edema Skin: Skin color, texture, turgor normal. No rashes or lesions No abnormal inguinal nodes palpated Neurologic: Grossly normal  Pelvic: External genitalia:  no lesions              Urethra:  normal appearing urethra with no masses, tenderness or lesions              Bartholins and Skenes: normal  Vagina: normal appearing vagina with normal color and discharge, no lesions              Cervix: no lesions                Bimanual Exam:  Uterus:  10 week size and nontender.              Adnexa: no mass, fullness, tenderness on right.  Had left adnexal mass and tenderness.         Chaperone was present for exam.  ASSESSMENT  Fibroids.  Abnormal uterine bleeding previously tx with Megace.  Left hydrosalpinx.  Status post right oophorectomy for dermoid.  Status post BTL.  Status post appendectomy.  Status post Cesarean Section.  Possible interstitial cystitis.  Smoker over age 46.   PLAN  Will plan for total laparoscopic hysterectomy and leftsalpingo-oophorectomy with pelvic washings and cystoscopy for symptomatic fibroids, complex left adnexal cyst, thought to be most consistent with a left hydrosalpinx.  I reviewed risks, benefits, and alternatives.  Risks include but are not limited to bleeding, infection, damage to surrounding organs, pneumonia, reaction to anesthesia, DVT, PE, death, need for reoperation, vaginal cuff dehiscence, neuropathy, need to convert to a traditional laparotomy incision to complete the procedure, and menopausal symptoms with increased risk of cardiovascular disease and osteoporosis with removal of her remaining ovary.  Surgical expectations and recovery discussed.  Patient wishes to proceed.  She will receive Lovenox for DVT/PE prophylaxis.  We discussed ERT after surgery.  She will update her mammogram.   ___25____ minutes face to face time of which over 50% was spent in counseling.

## 2019-05-20 ENCOUNTER — Other Ambulatory Visit: Payer: Self-pay

## 2019-05-20 ENCOUNTER — Encounter (HOSPITAL_COMMUNITY)
Admission: RE | Admit: 2019-05-20 | Discharge: 2019-05-20 | Disposition: A | Discharge status: Home / Self Care | Payer: 59 | Source: Ambulatory Visit | Attending: Obstetrics and Gynecology | Admitting: Obstetrics and Gynecology

## 2019-05-20 ENCOUNTER — Encounter (HOSPITAL_COMMUNITY): Payer: Self-pay

## 2019-05-20 DIAGNOSIS — Z01818 Encounter for other preprocedural examination: Secondary | ICD-10-CM | POA: Diagnosis not present

## 2019-05-20 DIAGNOSIS — Z01812 Encounter for preprocedural laboratory examination: Secondary | ICD-10-CM | POA: Diagnosis not present

## 2019-05-20 HISTORY — DX: Personal history of urinary calculi: Z87.442

## 2019-05-20 LAB — CBC
HCT: 42.4 % (ref 36.0–46.0)
Hemoglobin: 13.8 g/dL (ref 12.0–15.0)
MCH: 30.5 pg (ref 26.0–34.0)
MCHC: 32.5 g/dL (ref 30.0–36.0)
MCV: 93.6 fL (ref 80.0–100.0)
Platelets: 239 10*3/uL (ref 150–400)
RBC: 4.53 MIL/uL (ref 3.87–5.11)
RDW: 12.5 % (ref 11.5–15.5)
WBC: 8.7 10*3/uL (ref 4.0–10.5)
nRBC: 0 % (ref 0.0–0.2)

## 2019-05-20 NOTE — Progress Notes (Signed)
PCP - Dr. Marsa Aris Cardiologist - none  Chest x-ray - no EKG - no Stress Test - no ECHO - no Cardiac Cath - no  Sleep Study - no CPAP -   Fasting Blood Sugar - NA Checks Blood Sugar _____ times a day  Blood Thinner Instructions:NA Aspirin Instructions: Last Dose:  Anesthesia review:   Patient denies shortness of breath, fever, cough and chest pain at PAT appointment yes  Patient verbalized understanding of instructions that were given to them at the PAT appointment. Patient was also instructed that they will need to review over the PAT instructions again at home before surgery. yes

## 2019-05-21 LAB — ABO/RH: ABO/RH(D): A POS

## 2019-05-23 ENCOUNTER — Other Ambulatory Visit (HOSPITAL_COMMUNITY): Payer: Self-pay | Admitting: Obstetrics and Gynecology

## 2019-05-23 DIAGNOSIS — Z1231 Encounter for screening mammogram for malignant neoplasm of breast: Secondary | ICD-10-CM

## 2019-05-23 DIAGNOSIS — C689 Malignant neoplasm of urinary organ, unspecified: Secondary | ICD-10-CM

## 2019-05-23 HISTORY — DX: Malignant neoplasm of urinary organ, unspecified: C68.9

## 2019-05-23 NOTE — Telephone Encounter (Signed)
Spoke with patient. Patient is agreeable to proceed with screening mammogram at [facility]. Requested assistance with scheduling appointment. Advised will call Forestine Na to schedule and return call. Patient is agreeable.  Call to [facility]. Screening mammogram scheduled for 05/25/2019 at 12:15 pm. Spoke with patient who is agreeable to date and time.  Routing to provider and will close encounter.

## 2019-05-25 ENCOUNTER — Other Ambulatory Visit: Payer: Self-pay

## 2019-05-25 ENCOUNTER — Ambulatory Visit (HOSPITAL_COMMUNITY)
Admission: RE | Admit: 2019-05-25 | Discharge: 2019-05-25 | Disposition: A | Discharge status: Home / Self Care | Payer: 59 | Source: Ambulatory Visit | Attending: Obstetrics and Gynecology | Admitting: Obstetrics and Gynecology

## 2019-05-25 DIAGNOSIS — Z1231 Encounter for screening mammogram for malignant neoplasm of breast: Secondary | ICD-10-CM | POA: Diagnosis not present

## 2019-05-26 ENCOUNTER — Other Ambulatory Visit (HOSPITAL_COMMUNITY)
Admission: RE | Admit: 2019-05-26 | Discharge: 2019-05-26 | Disposition: A | Discharge status: Home / Self Care | Payer: 59 | Source: Ambulatory Visit | Attending: Obstetrics and Gynecology | Admitting: Obstetrics and Gynecology

## 2019-05-26 DIAGNOSIS — Z01812 Encounter for preprocedural laboratory examination: Secondary | ICD-10-CM | POA: Diagnosis not present

## 2019-05-26 DIAGNOSIS — Z20822 Contact with and (suspected) exposure to covid-19: Secondary | ICD-10-CM | POA: Diagnosis not present

## 2019-05-27 LAB — SARS CORONAVIRUS 2 (TAT 6-24 HRS): SARS Coronavirus 2: NEGATIVE

## 2019-05-29 NOTE — Anesthesia Preprocedure Evaluation (Addendum)
Anesthesia Evaluation  Patient identified by MRN, date of birth, ID band Patient awake    Reviewed: Allergy & Precautions, H&P , NPO status , Patient's Chart, lab work & pertinent test results  Airway Mallampati: I  TM Distance: >3 FB Neck ROM: Full    Dental no notable dental hx. (+) Teeth Intact, Dental Advisory Given   Pulmonary neg pulmonary ROS, Current Smoker,    Pulmonary exam normal breath sounds clear to auscultation       Cardiovascular Exercise Tolerance: Good negative cardio ROS Normal cardiovascular exam Rhythm:Regular Rate:Normal     Neuro/Psych PSYCHIATRIC DISORDERS Anxiety Depression negative neurological ROS  negative psych ROS   GI/Hepatic negative GI ROS, Neg liver ROS,   Endo/Other  negative endocrine ROS  Renal/GU negative Renal ROS  negative genitourinary   Musculoskeletal negative musculoskeletal ROS (+)   Abdominal   Peds negative pediatric ROS (+)  Hematology negative hematology ROS (+)   Anesthesia Other Findings   Reproductive/Obstetrics negative OB ROS                            Anesthesia Physical Anesthesia Plan  ASA: II  Anesthesia Plan: General   Post-op Pain Management:    Induction: Intravenous  PONV Risk Score and Plan: 3 and Ondansetron, Dexamethasone and Midazolam  Airway Management Planned: Oral ETT  Additional Equipment:   Intra-op Plan:   Post-operative Plan: Extubation in OR  Informed Consent: I have reviewed the patients History and Physical, chart, labs and discussed the procedure including the risks, benefits and alternatives for the proposed anesthesia with the patient or authorized representative who has indicated his/her understanding and acceptance.       Plan Discussed with: Anesthesiologist, CRNA and Surgeon  Anesthesia Plan Comments: (  )       Anesthesia Quick Evaluation

## 2019-05-29 NOTE — H&P (Signed)
Office Visit  05/19/2019 Anna Gonzalez, Anna All, MD Obstetrics and Gynecology  Abnormal uterine bleeding +3 more Dx  Surgery consult ; Referred by Mikey Kirschner, MD Reason for Visit  Additional Documentation  Vitals:    BP 104/70  Pulse 72  Temp 97.2 F (36.2 C)  (Temporal)  Ht 5' 3.75" (1.619 m)  Wt 60.4 kg  LMP 10/30/2018 (Approximate)  BMI 23.04 kg/m  BSA 1.65 m    More Vitals  Flowsheets:    NEWS,  MEWS Score,  Anthropometrics,  Method of Visit    Encounter Info:   Billing Info,  History,  Allergies,  Detailed Report    Gonzalez Notes   Progress Notes by Nunzio Cobbs, MD at 05/19/2019 3:30 PM Author: Nunzio Cobbs, MD Author Type: Physician Filed: 05/21/2019  9:43 AM  Note Status: Signed Cosign: Cosign Not Required Encounter Date: 05/19/2019  Editor: Nunzio Cobbs, MD (Physician)  Prior Versions: 1. Lowella Fairy, CMA (Certified Psychologist, sport and exercise) at 05/19/2019  3:40 PM - Sign when Signing Visit    GYNECOLOGY  VISIT   HPI: 44 y.o.   Divorced  Caucasian  female   [phone] with Patient's last menstrual period was 10/30/2018 (approximate).   here for surgery consult.    Interested in proceeding with total laparoscopic hysterectomy and left salpingo-oophorectomy  with pelvic washings for symptomatic fibroids, complex left ovarian cyst, thought to be most consistent with a left hydrosalpinx.  She has had multiple ultrasounds.   She has 3 fibroids, the largest of which is 3.82 cm.  Her left adnexa contains a cystic structure 34 x 14 x 15 mm.  Her left ovary has follicles.  No free fluid is noted.  Her uterus is 10 week size on exam.  EMB benign.  CA125 is 40 on 01/01/18.   She stopped Megace about one month ago.  She is now having spotting every couple of days.  No fatigue.  One occurrence of cramping like starting a menstruation.    She has a history of strong  pelvic pain and requests removal of her left ovary.    She declines future childbearing.    She declines surgery for her stress incontinence confirmed by urodynamic testing.  She no longer has burning with urination since stopping the Megace.    GYNECOLOGIC HISTORY: Patient's last menstrual period was 10/30/2018 (approximate). Contraception: Tubal  Menopausal hormone therapy:  None Last mammogram: 09-01-17 Diag.Bil.w/Bil US--Multiple benign cysts within each breast./density C/Neg/BiRads2/screening 72yr.  Last pap smear: 02-02-19 Neg:Neg HR HPV,  12/16/17 ASCUS:NEG HR HPV; 08/12/13 Neg:Neg HR HPV                OB History     Gravida  4   Para  3   Term  0   Preterm  0   AB  1   Living  3      SAB  1   TAB  0   Ectopic  0   Multiple  0   Live Births  0                     Patient Active Problem List    Diagnosis Date Noted  . Fibroids 02/02/2019  . Hydrosalpinx 02/02/2019  . Bilateral breast cysts 04/08/2018  . Depression, major, single episode, moderate ([place]) 01/23/2018  . Generalized anxiety disorder 01/23/2018  . Adnexal cyst    .  Female bladder prolapse 12/19/2017  . Genuine stress incontinence, female 12/19/2017  . Anxiety as acute reaction to exceptional stress 02/05/2015  . Mixed incontinence urge and stress 09/01/2013  . Dyspareunia 09/01/2013  . Menorrhagia 08/12/2013  . Dysmenorrhea 08/12/2013  . Back pain 01/06/2011  . Neck pain 01/06/2011  . NICOTINE ADDICTION 12/28/2008  . FATIGUE 12/28/2008  . UNSPECIFIED URINARY INCONTINENCE 12/28/2008          Past Medical History:  Diagnosis Date  . Anxiety    . Breast nodule 08/12/2013  . COVID-19 09/2018  . Depression    . Dysmenorrhea 08/12/2013  . Fibroid    . Menorrhagia 08/12/2013  . Ovarian cyst      left - 68 x 36 x 22 mm, complex, CA125 40  . UI (urinary incontinence) 08/12/2013           Past Surgical History:  Procedure Laterality Date  . APPENDECTOMY      . BREAST CYST EXCISION       . CESAREAN SECTION      . OOPHORECTOMY Right    . TUBAL LIGATION                Current Outpatient Medications  Medication Sig Dispense Refill  . clonazePAM (KLONOPIN) 1 MG tablet 1/2-1 po BID prn anxiety 30 tablet 3    No current facility-administered medications for this visit.      ALLERGIES: Zoloft [sertraline hcl]        Family History  Problem Relation Age of Onset  . Dementia Father    . Bipolar disorder Sister    . Cancer Maternal Grandmother          breast  . Stroke Maternal Grandfather    . Alzheimer's disease Paternal Grandmother    . Heart attack Paternal Grandfather        Social History         Socioeconomic History  . Marital status: Divorced      Spouse name: Not on file  . Number of children: Not on file  . Years of education: Not on file  . Highest education level: Not on file  Occupational History  . Not on file  Tobacco Use  . Smoking status: Current Every Day Smoker      Packs/day: 1.00  . Smokeless tobacco: Never Used  Substance and Sexual Activity  . Alcohol use: Yes      Alcohol/week: 2.0 standard drinks      Types: 2 Glasses of wine per week      Comment: occasionally  . Drug use: Never  . Sexual activity: Yes      Birth control/protection: None, Surgical  Other Topics Concern  . Not on file  Social History Narrative  . Not on file    Social Determinants of Health       Financial Resource Strain:   . Difficulty of Paying Living Expenses: Not on file  Food Insecurity:   . Worried About Charity fundraiser in the Last Year: Not on file  . Ran Out of Food in the Last Year: Not on file  Transportation Needs:   . Lack of Transportation (Medical): Not on file  . Lack of Transportation (Non-Medical): Not on file  Physical Activity:   . Days of Exercise per Week: Not on file  . Minutes of Exercise per Session: Not on file  Stress:   . Feeling of Stress : Not on file  Social Connections:   .  Frequency of Communication with  Friends and Family: Not on file  . Frequency of Social Gatherings with Friends and Family: Not on file  . Attends Religious Services: Not on file  . Active Member of Clubs or Organizations: Not on file  . Attends Archivist Meetings: Not on file  . Marital Status: Not on file  Intimate Partner Violence:   . Fear of Current or Ex-Partner: Not on file  . Emotionally Abused: Not on file  . Physically Abused: Not on file  . Sexually Abused: Not on file      Review of Systems  Gonzalez other systems reviewed and are negative.     PHYSICAL EXAMINATION:     BP 104/70   Pulse 72   Temp (!) 97.2 F (36.2 C) (Temporal)   Ht 5' 3.75" (1.619 m)   Wt 133 lb 3.2 oz (60.4 kg)   LMP 10/30/2018 (Approximate)   BMI 23.04 kg/m     General appearance: alert, cooperative and appears stated age Head: Normocephalic, without obvious abnormality, atraumatic Neck: no adenopathy, supple, symmetrical, trachea midline and thyroid normal to inspection and palpation Lungs: clear to auscultation bilaterally Heart: regular rate and rhythm Abdomen: soft, non-tender, no masses,  no organomegaly Extremities: extremities normal, atraumatic, no cyanosis or edema Skin: Skin color, texture, turgor normal. No rashes or lesions No abnormal inguinal nodes palpated Neurologic: Grossly normal   Pelvic: External genitalia:  no lesions              Urethra:  normal appearing urethra with no masses, tenderness or lesions              Bartholins and Skenes: normal                 Vagina: normal appearing vagina with normal color and discharge, no lesions              Cervix: no lesions                Bimanual Exam:  Uterus:  10 week size and nontender.              Adnexa: no mass, fullness, tenderness on right.  Had left adnexal mass and tenderness.         Chaperone was present for exam.   ASSESSMENT   Fibroids.  Abnormal uterine bleeding previously tx with Megace.  Left hydrosalpinx.  Status post right  oophorectomy for dermoid.  Status post BTL.  Status post appendectomy.  Status post Cesarean Section.  Possible interstitial cystitis.  Smoker over age 40.     PLAN   Will plan for total laparoscopic hysterectomy and left salpingo-oophorectomy with pelvic washings and cystoscopy for symptomatic fibroids, complex left adnexal cyst, thought to be most consistent with a left hydrosalpinx.   I reviewed risks, benefits, and alternatives.  Risks include but are not limited to bleeding, infection, damage to surrounding organs, pneumonia, reaction to anesthesia, DVT, PE, death, need for reoperation, vaginal cuff dehiscence, neuropathy, need to convert to a traditional laparotomy incision to complete the procedure, and menopausal symptoms with increased risk of cardiovascular disease and osteoporosis with removal of her remaining ovary.  Surgical expectations and recovery discussed.  Patient wishes to proceed.   She will receive Lovenox for DVT/PE prophylaxis.   We discussed ERT after surgery.   She will update her mammogram.    ___25____ minutes face to face time of which over 50% was spent in counseling.

## 2019-05-30 ENCOUNTER — Other Ambulatory Visit: Payer: Self-pay

## 2019-05-30 ENCOUNTER — Encounter (HOSPITAL_BASED_OUTPATIENT_CLINIC_OR_DEPARTMENT_OTHER): Payer: Self-pay | Admitting: Obstetrics and Gynecology

## 2019-05-30 ENCOUNTER — Ambulatory Visit (HOSPITAL_BASED_OUTPATIENT_CLINIC_OR_DEPARTMENT_OTHER)
Admission: RE | Admit: 2019-05-30 | Discharge: 2019-05-30 | Disposition: A | Discharge status: Home / Self Care | Payer: 59 | Attending: Obstetrics and Gynecology | Admitting: Obstetrics and Gynecology

## 2019-05-30 ENCOUNTER — Observation Stay (HOSPITAL_BASED_OUTPATIENT_CLINIC_OR_DEPARTMENT_OTHER): Payer: 59 | Admitting: Physician Assistant

## 2019-05-30 ENCOUNTER — Observation Stay (HOSPITAL_BASED_OUTPATIENT_CLINIC_OR_DEPARTMENT_OTHER): Payer: 59 | Admitting: Anesthesiology

## 2019-05-30 ENCOUNTER — Encounter (HOSPITAL_BASED_OUTPATIENT_CLINIC_OR_DEPARTMENT_OTHER)
Admission: RE | Disposition: A | Discharge status: Home / Self Care | Payer: Self-pay | Source: Home / Self Care | Attending: Obstetrics and Gynecology

## 2019-05-30 DIAGNOSIS — N839 Noninflammatory disorder of ovary, fallopian tube and broad ligament, unspecified: Secondary | ICD-10-CM | POA: Diagnosis not present

## 2019-05-30 DIAGNOSIS — N83202 Unspecified ovarian cyst, left side: Secondary | ICD-10-CM | POA: Diagnosis not present

## 2019-05-30 DIAGNOSIS — N939 Abnormal uterine and vaginal bleeding, unspecified: Secondary | ICD-10-CM | POA: Insufficient documentation

## 2019-05-30 DIAGNOSIS — F17200 Nicotine dependence, unspecified, uncomplicated: Secondary | ICD-10-CM | POA: Diagnosis not present

## 2019-05-30 DIAGNOSIS — Z79899 Other long term (current) drug therapy: Secondary | ICD-10-CM | POA: Insufficient documentation

## 2019-05-30 DIAGNOSIS — D259 Leiomyoma of uterus, unspecified: Secondary | ICD-10-CM | POA: Insufficient documentation

## 2019-05-30 DIAGNOSIS — N7011 Chronic salpingitis: Secondary | ICD-10-CM | POA: Diagnosis not present

## 2019-05-30 DIAGNOSIS — N879 Dysplasia of cervix uteri, unspecified: Secondary | ICD-10-CM | POA: Insufficient documentation

## 2019-05-30 DIAGNOSIS — C679 Malignant neoplasm of bladder, unspecified: Secondary | ICD-10-CM | POA: Diagnosis not present

## 2019-05-30 DIAGNOSIS — R102 Pelvic and perineal pain: Secondary | ICD-10-CM | POA: Insufficient documentation

## 2019-05-30 DIAGNOSIS — K660 Peritoneal adhesions (postprocedural) (postinfection): Secondary | ICD-10-CM | POA: Insufficient documentation

## 2019-05-30 DIAGNOSIS — N840 Polyp of corpus uteri: Secondary | ICD-10-CM | POA: Diagnosis not present

## 2019-05-30 DIAGNOSIS — Z90710 Acquired absence of both cervix and uterus: Secondary | ICD-10-CM | POA: Diagnosis present

## 2019-05-30 DIAGNOSIS — D219 Benign neoplasm of connective and other soft tissue, unspecified: Secondary | ICD-10-CM | POA: Diagnosis present

## 2019-05-30 DIAGNOSIS — D251 Intramural leiomyoma of uterus: Secondary | ICD-10-CM | POA: Diagnosis not present

## 2019-05-30 DIAGNOSIS — C672 Malignant neoplasm of lateral wall of bladder: Secondary | ICD-10-CM | POA: Insufficient documentation

## 2019-05-30 DIAGNOSIS — F411 Generalized anxiety disorder: Secondary | ICD-10-CM | POA: Insufficient documentation

## 2019-05-30 DIAGNOSIS — Z8616 Personal history of COVID-19: Secondary | ICD-10-CM | POA: Insufficient documentation

## 2019-05-30 DIAGNOSIS — D250 Submucous leiomyoma of uterus: Secondary | ICD-10-CM | POA: Diagnosis not present

## 2019-05-30 DIAGNOSIS — D494 Neoplasm of unspecified behavior of bladder: Secondary | ICD-10-CM | POA: Diagnosis not present

## 2019-05-30 DIAGNOSIS — N83292 Other ovarian cyst, left side: Secondary | ICD-10-CM | POA: Diagnosis not present

## 2019-05-30 DIAGNOSIS — N8301 Follicular cyst of right ovary: Secondary | ICD-10-CM | POA: Diagnosis not present

## 2019-05-30 DIAGNOSIS — N736 Female pelvic peritoneal adhesions (postinfective): Secondary | ICD-10-CM | POA: Diagnosis not present

## 2019-05-30 HISTORY — PX: CYSTOSCOPY: SHX5120

## 2019-05-30 HISTORY — PX: TOTAL LAPAROSCOPIC HYSTERECTOMY WITH SALPINGECTOMY: SHX6742

## 2019-05-30 LAB — CBC
HCT: 41.2 % (ref 36.0–46.0)
Hemoglobin: 13.1 g/dL (ref 12.0–15.0)
MCH: 30.5 pg (ref 26.0–34.0)
MCHC: 31.8 g/dL (ref 30.0–36.0)
MCV: 96 fL (ref 80.0–100.0)
Platelets: 211 10*3/uL (ref 150–400)
RBC: 4.29 MIL/uL (ref 3.87–5.11)
RDW: 13.2 % (ref 11.5–15.5)
WBC: 12.3 10*3/uL — ABNORMAL HIGH (ref 4.0–10.5)
nRBC: 0 % (ref 0.0–0.2)

## 2019-05-30 LAB — TYPE AND SCREEN
ABO/RH(D): A POS
Antibody Screen: NEGATIVE

## 2019-05-30 LAB — POCT PREGNANCY, URINE: Preg Test, Ur: NEGATIVE

## 2019-05-30 SURGERY — HYSTERECTOMY, TOTAL, LAPAROSCOPIC, WITH SALPINGECTOMY
Anesthesia: General | Site: Bladder

## 2019-05-30 MED ORDER — MIDAZOLAM HCL 2 MG/2ML IJ SOLN
INTRAMUSCULAR | Status: AC
  Filled 2019-05-30: qty 2

## 2019-05-30 MED ORDER — KETOROLAC TROMETHAMINE 30 MG/ML IJ SOLN
INTRAMUSCULAR | Status: AC
  Filled 2019-05-30: qty 1

## 2019-05-30 MED ORDER — PROPOFOL 10 MG/ML IV BOLUS
INTRAVENOUS | Status: DC | PRN
  Administered 2019-05-30: 150 mg via INTRAVENOUS
  Administered 2019-05-30: 50 mg via INTRAVENOUS

## 2019-05-30 MED ORDER — ARTIFICIAL TEARS OPHTHALMIC OINT
TOPICAL_OINTMENT | OPHTHALMIC | Status: AC
  Filled 2019-05-30: qty 3.5

## 2019-05-30 MED ORDER — LACTATED RINGERS IV SOLN
INTRAVENOUS | Status: DC
  Filled 2019-05-30: qty 1000

## 2019-05-30 MED ORDER — OXYCODONE HCL 5 MG/5ML PO SOLN
5.0000 mg | Freq: Once | ORAL | Status: DC | PRN
  Filled 2019-05-30: qty 5

## 2019-05-30 MED ORDER — DEXAMETHASONE SODIUM PHOSPHATE 10 MG/ML IJ SOLN
INTRAMUSCULAR | Status: AC
  Filled 2019-05-30: qty 1

## 2019-05-30 MED ORDER — IBUPROFEN 800 MG PO TABS: 800.0000 mg | ORAL_TABLET | Freq: Three times a day (TID) | ORAL | 0 refills | Status: DC | PRN

## 2019-05-30 MED ORDER — SCOPOLAMINE 1 MG/3DAYS TD PT72
MEDICATED_PATCH | TRANSDERMAL | Status: AC
  Filled 2019-05-30: qty 1

## 2019-05-30 MED ORDER — OXYCODONE HCL 5 MG PO TABS
5.0000 mg | ORAL_TABLET | Freq: Once | ORAL | Status: DC | PRN
  Filled 2019-05-30: qty 1

## 2019-05-30 MED ORDER — BUPIVACAINE HCL (PF) 0.25 % IJ SOLN
INTRAMUSCULAR | Status: DC | PRN
  Administered 2019-05-30: 25 mL

## 2019-05-30 MED ORDER — ENOXAPARIN SODIUM 40 MG/0.4ML ~~LOC~~ SOLN
SUBCUTANEOUS | Status: AC
  Filled 2019-05-30: qty 0.4

## 2019-05-30 MED ORDER — MEPERIDINE HCL 25 MG/ML IJ SOLN
6.2500 mg | INTRAMUSCULAR | Status: DC | PRN
  Filled 2019-05-30: qty 1

## 2019-05-30 MED ORDER — FENTANYL CITRATE (PF) 100 MCG/2ML IJ SOLN
25.0000 ug | INTRAMUSCULAR | Status: DC | PRN
  Administered 2019-05-30 (×2): 50 ug via INTRAVENOUS
  Filled 2019-05-30: qty 1

## 2019-05-30 MED ORDER — SODIUM CHLORIDE 0.9 % IR SOLN
Status: DC | PRN
  Administered 2019-05-30: 1000 mL via INTRAVESICAL
  Administered 2019-05-30: 3000 mL

## 2019-05-30 MED ORDER — MORPHINE SULFATE (PF) 2 MG/ML IV SOLN
INTRAVENOUS | Status: AC
  Filled 2019-05-30: qty 1

## 2019-05-30 MED ORDER — FENTANYL CITRATE (PF) 100 MCG/2ML IJ SOLN
INTRAMUSCULAR | Status: DC | PRN
  Administered 2019-05-30: 50 ug via INTRAVENOUS
  Administered 2019-05-30 (×2): 100 ug via INTRAVENOUS

## 2019-05-30 MED ORDER — OXYCODONE-ACETAMINOPHEN 5-325 MG PO TABS
1.0000 | ORAL_TABLET | ORAL | Status: DC | PRN
  Administered 2019-05-30: 1 via ORAL
  Filled 2019-05-30: qty 2

## 2019-05-30 MED ORDER — SODIUM CHLORIDE 0.9 % IV SOLN
2.0000 g | INTRAVENOUS | Status: AC
  Administered 2019-05-30: 2 g via INTRAVENOUS
  Filled 2019-05-30: qty 2

## 2019-05-30 MED ORDER — KETAMINE HCL 10 MG/ML IJ SOLN
INTRAMUSCULAR | Status: DC | PRN
  Administered 2019-05-30: 10 mg via INTRAVENOUS
  Administered 2019-05-30: 30 mg via INTRAVENOUS

## 2019-05-30 MED ORDER — MENTHOL 3 MG MT LOZG
1.0000 | LOZENGE | OROMUCOSAL | Status: DC | PRN
  Filled 2019-05-30: qty 9

## 2019-05-30 MED ORDER — ENOXAPARIN SODIUM 40 MG/0.4ML ~~LOC~~ SOLN
40.0000 mg | SUBCUTANEOUS | Status: AC
  Administered 2019-05-30: 40 mg via SUBCUTANEOUS
  Filled 2019-05-30: qty 0.4

## 2019-05-30 MED ORDER — KETOROLAC TROMETHAMINE 30 MG/ML IJ SOLN
INTRAMUSCULAR | Status: DC | PRN
  Administered 2019-05-30: 30 mg via INTRAVENOUS

## 2019-05-30 MED ORDER — LIDOCAINE 2% (20 MG/ML) 5 ML SYRINGE
INTRAMUSCULAR | Status: DC | PRN
  Administered 2019-05-30: 1.5 mg/kg/h via INTRAVENOUS
  Administered 2019-05-30: 70 mg via INTRAVENOUS

## 2019-05-30 MED ORDER — FENTANYL CITRATE (PF) 100 MCG/2ML IJ SOLN
INTRAMUSCULAR | Status: AC
  Filled 2019-05-30: qty 2

## 2019-05-30 MED ORDER — ONDANSETRON HCL 4 MG/2ML IJ SOLN
INTRAMUSCULAR | Status: DC | PRN
  Administered 2019-05-30: 4 mg via INTRAVENOUS

## 2019-05-30 MED ORDER — FENTANYL CITRATE (PF) 250 MCG/5ML IJ SOLN
INTRAMUSCULAR | Status: AC
  Filled 2019-05-30: qty 5

## 2019-05-30 MED ORDER — ROCURONIUM BROMIDE 10 MG/ML (PF) SYRINGE
PREFILLED_SYRINGE | INTRAVENOUS | Status: AC
  Filled 2019-05-30: qty 10

## 2019-05-30 MED ORDER — ACETAMINOPHEN 10 MG/ML IV SOLN
INTRAVENOUS | Status: DC | PRN
  Administered 2019-05-30: 1000 mg via INTRAVENOUS

## 2019-05-30 MED ORDER — LACTATED RINGERS IV SOLN
INTRAVENOUS | Status: DC
  Filled 2019-05-30 (×2): qty 1000

## 2019-05-30 MED ORDER — ACETAMINOPHEN 160 MG/5ML PO SOLN
325.0000 mg | ORAL | Status: DC | PRN
  Filled 2019-05-30: qty 20.3

## 2019-05-30 MED ORDER — MORPHINE SULFATE (PF) 2 MG/ML IV SOLN
1.0000 mg | INTRAVENOUS | Status: DC | PRN
  Administered 2019-05-30: 2 mg via INTRAVENOUS
  Filled 2019-05-30: qty 1

## 2019-05-30 MED ORDER — LIDOCAINE HCL 2 % IJ SOLN
INTRAMUSCULAR | Status: AC
  Filled 2019-05-30: qty 20

## 2019-05-30 MED ORDER — SCOPOLAMINE 1 MG/3DAYS TD PT72
MEDICATED_PATCH | TRANSDERMAL | Status: DC | PRN
  Administered 2019-05-30: 1 via TRANSDERMAL

## 2019-05-30 MED ORDER — PROPOFOL 10 MG/ML IV BOLUS
INTRAVENOUS | Status: AC
  Filled 2019-05-30: qty 20

## 2019-05-30 MED ORDER — ONDANSETRON HCL 4 MG/2ML IJ SOLN
4.0000 mg | Freq: Four times a day (QID) | INTRAMUSCULAR | Status: DC | PRN
  Filled 2019-05-30: qty 2

## 2019-05-30 MED ORDER — SUGAMMADEX SODIUM 200 MG/2ML IV SOLN
INTRAVENOUS | Status: DC | PRN
  Administered 2019-05-30: 100 mg via INTRAVENOUS

## 2019-05-30 MED ORDER — KETOROLAC TROMETHAMINE 30 MG/ML IJ SOLN
30.0000 mg | Freq: Four times a day (QID) | INTRAMUSCULAR | Status: DC | PRN
  Filled 2019-05-30: qty 1

## 2019-05-30 MED ORDER — ONDANSETRON HCL 4 MG/2ML IJ SOLN
INTRAMUSCULAR | Status: AC
  Filled 2019-05-30: qty 2

## 2019-05-30 MED ORDER — DEXAMETHASONE SODIUM PHOSPHATE 10 MG/ML IJ SOLN
INTRAMUSCULAR | Status: DC | PRN
  Administered 2019-05-30: 10 mg via INTRAVENOUS

## 2019-05-30 MED ORDER — OXYCODONE-ACETAMINOPHEN 5-325 MG PO TABS
ORAL_TABLET | ORAL | Status: AC
  Filled 2019-05-30: qty 2

## 2019-05-30 MED ORDER — LIDOCAINE 2% (20 MG/ML) 5 ML SYRINGE
INTRAMUSCULAR | Status: AC
Start: 2019-05-30 — End: ?
  Filled 2019-05-30: qty 5

## 2019-05-30 MED ORDER — MIDAZOLAM HCL 2 MG/2ML IJ SOLN
INTRAMUSCULAR | Status: DC | PRN
  Administered 2019-05-30: 2 mg via INTRAVENOUS

## 2019-05-30 MED ORDER — IBUPROFEN 800 MG PO TABS
800.0000 mg | ORAL_TABLET | Freq: Three times a day (TID) | ORAL | Status: DC | PRN
  Filled 2019-05-30: qty 1

## 2019-05-30 MED ORDER — ACETAMINOPHEN 10 MG/ML IV SOLN
INTRAVENOUS | Status: AC
  Filled 2019-05-30: qty 100

## 2019-05-30 MED ORDER — SODIUM CHLORIDE 0.9 % IV SOLN
INTRAVENOUS | Status: AC
  Filled 2019-05-30: qty 2

## 2019-05-30 MED ORDER — KETAMINE HCL 10 MG/ML IJ SOLN
INTRAMUSCULAR | Status: AC
  Filled 2019-05-30: qty 1

## 2019-05-30 MED ORDER — OXYCODONE-ACETAMINOPHEN 5-325 MG PO TABS: 1.0000 | ORAL_TABLET | ORAL | 0 refills | Status: DC | PRN

## 2019-05-30 MED ORDER — ONDANSETRON HCL 4 MG/2ML IJ SOLN
4.0000 mg | Freq: Once | INTRAMUSCULAR | Status: DC | PRN
  Filled 2019-05-30: qty 2

## 2019-05-30 MED ORDER — ONDANSETRON HCL 4 MG PO TABS
4.0000 mg | ORAL_TABLET | Freq: Four times a day (QID) | ORAL | Status: DC | PRN
  Filled 2019-05-30: qty 1

## 2019-05-30 MED ORDER — ROCURONIUM BROMIDE 10 MG/ML (PF) SYRINGE
PREFILLED_SYRINGE | INTRAVENOUS | Status: DC | PRN
  Administered 2019-05-30: 60 mg via INTRAVENOUS
  Administered 2019-05-30 (×2): 20 mg via INTRAVENOUS

## 2019-05-30 MED ORDER — ACETAMINOPHEN 325 MG PO TABS
325.0000 mg | ORAL_TABLET | ORAL | Status: DC | PRN
  Filled 2019-05-30: qty 2

## 2019-05-30 SURGICAL SUPPLY — 64 items
APPLICATOR ARISTA FLEXITIP XL (MISCELLANEOUS) ×3 IMPLANT
BARRIER ADHS 3X4 INTERCEED (GAUZE/BANDAGES/DRESSINGS) IMPLANT
CABLE HIGH FREQUENCY MONO STRZ (ELECTRODE) IMPLANT
CANISTER SUCT 3000ML PPV (MISCELLANEOUS) ×3 IMPLANT
CELL SAVER LIPIGURD (MISCELLANEOUS) IMPLANT
COVER BACK TABLE 60X90IN (DRAPES) IMPLANT
COVER MAYO STAND STRL (DRAPES) ×3 IMPLANT
COVER WAND RF STERILE (DRAPES) ×3 IMPLANT
DECANTER SPIKE VIAL GLASS SM (MISCELLANEOUS) ×6 IMPLANT
DERMABOND ADVANCED (GAUZE/BANDAGES/DRESSINGS) ×1
DERMABOND ADVANCED .7 DNX12 (GAUZE/BANDAGES/DRESSINGS) ×2 IMPLANT
DURAPREP 26ML APPLICATOR (WOUND CARE) ×3 IMPLANT
EXTRT SYSTEM ALEXIS 14CM (MISCELLANEOUS)
EXTRT SYSTEM ALEXIS 17CM (MISCELLANEOUS)
GAUZE 4X4 16PLY RFD (DISPOSABLE) ×3 IMPLANT
GLOVE BIO SURGEON STRL SZ 6.5 (GLOVE) ×12 IMPLANT
GLOVE BIOGEL PI IND STRL 7.0 (GLOVE) ×12 IMPLANT
GLOVE BIOGEL PI INDICATOR 7.0 (GLOVE) ×6
GOWN STRL REUS W/TWL LRG LVL3 (GOWN DISPOSABLE) ×18 IMPLANT
HEMOSTAT ARISTA ABSORB 3G PWDR (HEMOSTASIS) ×3 IMPLANT
HOLDER FOLEY CATH W/STRAP (MISCELLANEOUS) ×3 IMPLANT
LIGASURE VESSEL 5MM BLUNT TIP (ELECTROSURGICAL) ×3 IMPLANT
LOOP CUT BIPOLAR 24F LRG (ELECTROSURGICAL) ×3 IMPLANT
NEEDLE INSUFFLATION 120MM (ENDOMECHANICALS) ×3 IMPLANT
OCCLUDER COLPOPNEUMO (BALLOONS) ×3 IMPLANT
PACK LAPAROSCOPY BASIN (CUSTOM PROCEDURE TRAY) ×3 IMPLANT
PACK TRENDGUARD 450 HYBRID PRO (MISCELLANEOUS) ×2 IMPLANT
POUCH LAPAROSCOPIC INSTRUMENT (MISCELLANEOUS) ×3 IMPLANT
PROTECTOR NERVE ULNAR (MISCELLANEOUS) ×6 IMPLANT
RETRACTOR WOUND ALXS 19CM XSML (INSTRUMENTS) IMPLANT
RTRCTR WOUND ALEXIS 19CM XSML (INSTRUMENTS)
SCISSORS LAP 5X35 DISP (ENDOMECHANICALS) IMPLANT
SET IRRIG TUBING LAPAROSCOPIC (IRRIGATION / IRRIGATOR) ×3 IMPLANT
SET IRRIG Y TYPE TUR BLADDER L (SET/KITS/TRAYS/PACK) ×3 IMPLANT
SET TRI-LUMEN FLTR TB AIRSEAL (TUBING) ×3 IMPLANT
SHEARS HARMONIC ACE PLUS 36CM (ENDOMECHANICALS) ×3 IMPLANT
SLEEVE ADV FIXATION 5X100MM (TROCAR) ×3 IMPLANT
SLEEVE SURGEON STRL (DRAPES) ×3 IMPLANT
SUT VIC AB 0 CT1 27 (SUTURE) ×6
SUT VIC AB 0 CT1 27XBRD ANBCTR (SUTURE) ×4 IMPLANT
SUT VICRYL 0 UR6 27IN ABS (SUTURE) ×6 IMPLANT
SUT VICRYL 4-0 PS2 18IN ABS (SUTURE) ×3 IMPLANT
SUT VLOC 180 0 6IN GS21 (SUTURE) ×3 IMPLANT
SUT VLOC 180 0 9IN  GS21 (SUTURE) ×6
SUT VLOC 180 0 9IN GS21 (SUTURE) ×4 IMPLANT
SYR 10ML LL (SYRINGE) ×3 IMPLANT
SYR 50ML LL SCALE MARK (SYRINGE) ×6 IMPLANT
SYR TOOMEY IRRIG 70ML (MISCELLANEOUS) ×3
SYRINGE TOOMEY IRRIG 70ML (MISCELLANEOUS) ×2 IMPLANT
SYSTEM CARTER THOMASON II (TROCAR) ×3 IMPLANT
SYSTEM CONTND EXTRCTN KII BLLN (MISCELLANEOUS) IMPLANT
TIP RUMI ORANGE 6.7MMX12CM (TIP) IMPLANT
TIP UTERINE 5.1X6CM LAV DISP (MISCELLANEOUS) IMPLANT
TIP UTERINE 6.7X10CM GRN DISP (MISCELLANEOUS) IMPLANT
TIP UTERINE 6.7X6CM WHT DISP (MISCELLANEOUS) IMPLANT
TIP UTERINE 6.7X8CM BLUE DISP (MISCELLANEOUS) ×3 IMPLANT
TOWEL OR 17X26 10 PK STRL BLUE (TOWEL DISPOSABLE) ×3 IMPLANT
TRAP SPECIMEN MUCOUS 40CC (MISCELLANEOUS) ×3 IMPLANT
TRAY FOLEY W/BAG SLVR 14FR (SET/KITS/TRAYS/PACK) ×3 IMPLANT
TRENDGUARD 450 HYBRID PRO PACK (MISCELLANEOUS) ×3
TROCAR ADV FIXATION 5X100MM (TROCAR) ×3 IMPLANT
TROCAR BLADELESS OPT 5 100 (ENDOMECHANICALS) ×3 IMPLANT
TROCAR PORT AIRSEAL 8X120 (TROCAR) ×3 IMPLANT
WARMER LAPAROSCOPE (MISCELLANEOUS) ×3 IMPLANT

## 2019-05-30 NOTE — Op Note (Signed)
Operative Note  Preoperative diagnosis:  1.  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  Cystoscopy with transurethral resection of bladder tumor (2 cm)  Surgeon: Ellison Hughs, MD  Assistants:  None  Anesthesia:  General  Complications:  None  EBL: 5 mL  Specimens: 1.  Superficial and deep margins of bladder tumor  Drains/Catheters: 1.  None  Intraoperative findings:   1. 2 cm papillary bladder lesion involving the right lateral wall  Indication:  Anna Gonzalez is a 44 y.o. female undergoing a hysterectomy with Dr. Quincy Simmonds.  I was consulted intraoperatively to assess the patient after she was found to have a papillary bladder lesion on routine cystoscopy following her hysterectomy.  Informed consent could not be obtained due to the patient being under general anesthesia and no power of attorney available.  Description of procedure:  After informed consent was obtained, the patient was brought to the operating room and general LMA anesthesia was administered. The patient was then placed in the dorsolithotomy position and prepped and draped in the usual sterile fashion. A timeout was performed. A 23 French rigid cystoscope was then inserted into the urethral meatus and advanced into the bladder under direct vision. 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  A 26 French resectoscope with a bipolar loop working element was then inserted into the urethra.  The bladder tumor was then resected down to the mucosal margin.  The lesion was then evacuated from the bladder through the sheath of the cystoscope.  A deep margin of the tumor was obtained by resecting down to the detrusor musculature and was sent to pathology separately.  The area of resection was then extensively fulgurated  until hemostasis was achieved.  The patient tolerated the procedure well and was transferred to the postanesthesia in stable condition.  Plan: Follow-up in 2 weeks to discuss pathology report

## 2019-05-30 NOTE — Progress Notes (Signed)
Called by RN and asked to evaluate patient at Dr. Elza Rafter request. Patient seen in [place] [facility]. She complains of facial swelling. She denies difficulty swallowing or breathing. Vital signs stable, lungs clear to ascultation bilaterally, with slight crepitus. No acute intervention required at this time. Will defer to Dr. Quincy Simmonds regarding post-operative course.

## 2019-05-30 NOTE — Consult Note (Addendum)
Urology Consult   Physician requesting consult: Dr. Josefa Half, MD  Reason for consult: Bladder tumor seen on cystoscopy following hysterectomy  History of Present Illness: Anna Gonzalez is a 44 y.o. female who was in the midst of a laparoscopic hysterectomy for ongoing issues with uterine fibroids.  Dr. 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  I was consulted to assess the lesion intraoperatively and performed a TURBT.  Postoperatively, the patient reports that she has been having intermittent episodes of dysuria along with a sensation of incomplete bladder emptying for the past several months.  She denies any prior history of gross hematuria or issues with recurrent urinary tract infections.  She does have a prior history of kidney stones.  She denies any personal/family history of GU malignancies.  She reports a 25+, 1 ppd smoking history. She is currently recovering from her surgeries without any complaints.  Past Medical History:  Diagnosis Date  . Anxiety   . Breast nodule 08/12/2013  . COVID-19 09/2018  . Depression   . Dysmenorrhea 08/12/2013  . Fibroid   . History of kidney stones   . Menorrhagia 08/12/2013  . Ovarian cyst    left - 68 x 36 x 22 mm, complex, CA125 40  . UI (urinary incontinence) 08/12/2013    Past Surgical History:  Procedure Laterality Date  . APPENDECTOMY    . BREAST CYST EXCISION    . CESAREAN SECTION    . OOPHORECTOMY Right   . TUBAL LIGATION      Current Hospital Medications:  Home Meds:  Current Meds  Medication Sig  . clonazePAM (KLONOPIN) 1 MG tablet 1/2-1 po BID prn anxiety (Patient taking differently: Take 0.5-1 mg by mouth 2 (two) times daily as needed for anxiety. )  . naproxen sodium (ALEVE) 220 MG tablet Take 220 mg by mouth daily as needed (pain).    Scheduled Meds: Continuous Infusions: . lactated ringers  650 mL/hr at 05/30/19 1020  . lactated ringers     PRN Meds:.acetaminophen **OR** acetaminophen (TYLENOL) oral liquid 160 mg/5 mL, fentaNYL (SUBLIMAZE) injection, ibuprofen, ketorolac, menthol-cetylpyridinium, meperidine (DEMEROL) injection, morphine injection, ondansetron (ZOFRAN) IV, ondansetron **OR** ondansetron (ZOFRAN) IV, oxyCODONE **OR** oxyCODONE, oxyCODONE-acetaminophen  Allergies:  Allergies  Allergen Reactions  . Zoloft [Sertraline Hcl] Other (See Comments)    Did not "feel right"    Family History  Problem Relation Age of Onset  . Dementia Father   . Bipolar disorder Sister   . Cancer Maternal Grandmother        breast  . Stroke Maternal Grandfather   . Alzheimer's disease Paternal Grandmother   . Heart attack Paternal Grandfather     Social History:  reports that she has been smoking. She has been smoking about 1.00 pack per day. She has never used smokeless tobacco. She reports current alcohol use of about 2.0 standard drinks of alcohol per week. She reports that she does not use drugs.  ROS: A complete review of systems was performed.  All systems are negative except for pertinent findings as noted.  Physical Exam:  Vital signs in last 24 hours: Temp:  [98 F (36.7 C)-99 F (37.2 C)] 98.3 F (36.8 C) (03/08 1400) Pulse Rate:  [63-90] 74 (03/08 1400) Resp:  [12-19] 18 (03/08 1400) BP: (102-127)/(56-81) 106/61 (03/08 1400) SpO2:  [97 %-100 %] 97 % (03/08 1400) Weight:  [60.5 kg] 60.5  kg (03/08 0530) Constitutional:  Alert and oriented, No acute distress Cardiovascular: Regular rate and rhythm, No JVD Respiratory: Normal respiratory effort, Lungs clear bilaterally GI: Incisions are clean, dry and intact.  The abdomen is nondistended and appropriately tender GU: No CVA tenderness Lymphatic: No lymphadenopathy Neurologic: Grossly intact, no focal deficits Psychiatric: Normal mood and affect  Laboratory Data:  No results for input(s): WBC, HGB, HCT, PLT in  the last 72 hours.  No results for input(s): NA, K, CL, GLUCOSE, BUN, CALCIUM, CREATININE in the last 72 hours.  Invalid input(s): CO3   Results for orders placed or performed during the hospital encounter of 05/30/19 (from the past 24 hour(s))  Pregnancy, urine POC     Status: None   Collection Time: 05/30/19  5:34 AM  Result Value Ref Range   Preg Test, Ur NEGATIVE NEGATIVE   Recent Results (from the past 240 hour(s))  SARS CORONAVIRUS 2 (TAT 6-24 HRS) Nasopharyngeal Nasopharyngeal Swab     Status: None   Collection Time: 05/26/19  3:02 PM   Specimen: Nasopharyngeal Swab  Result Value Ref Range Status   SARS Coronavirus 2 NEGATIVE NEGATIVE Final    Comment: (NOTE) SARS-CoV-2 target nucleic acids are NOT DETECTED. The SARS-CoV-2 RNA is generally detectable in upper and lower respiratory specimens during the acute phase of infection. Negative results do not preclude SARS-CoV-2 infection, do not rule out co-infections with other pathogens, and should not be used as the sole basis for treatment or other patient management decisions. Negative results must be combined with clinical observations, patient history, and epidemiological information. The expected result is Negative. Fact Sheet for Patients: SugarRoll.be Fact Sheet for Healthcare Providers: https://www.woods-mathews.com/ This test is not yet approved or cleared by the [place] FDA and  has been authorized for detection and/or diagnosis of SARS-CoV-2 by FDA under an Emergency Use Authorization (EUA). This EUA will remain  in effect (meaning this test can be used) for the duration of the COVID-19 declaration under Section 56 4(b)(1) of the Act, 21 U.S.C. section 360bbb-3(b)(1), unless the authorization is terminated or revoked sooner. Performed at [facility], [place] [address] [place], [place] [address]     Renal Function: No results for input(s): CREATININE in the  last 168 hours. CrCl cannot be calculated (Patient's most recent lab result is older than the maximum 21 days allowed.).  Radiologic Imaging: No results found.  I independently reviewed the above imaging studies.  Impression/Recommendation 44 year old female with a 2 cm papillary bladder tumor, suspicious for urothelial carcinoma s/p TURBT  -TURBT procedure and post-op course discussed in detail. -I will arrange follow-up in 2 weeks to discuss her pathology results and coordinate treatment/staging of her suspected bladder cancer from there.  Ellison Hughs, MD [facility] 05/30/2019, 3:56 PM

## 2019-05-30 NOTE — Anesthesia Procedure Notes (Signed)
Procedure Name: Intubation Date/Time: 05/30/2019 7:38 AM Performed by: Wanita Chamberlain, CRNA Pre-anesthesia Checklist: Patient identified, Emergency Drugs available, Suction available and Patient being monitored Patient Re-evaluated:Patient Re-evaluated prior to induction Oxygen Delivery Method: Circle system utilized Preoxygenation: Pre-oxygenation with 100% oxygen Induction Type: IV induction Ventilation: Mask ventilation without difficulty Laryngoscope Size: Mac and 3 Grade View: Grade I Tube type: Oral Tube size: 7.0 mm Number of attempts: 1 Airway Equipment and Method: Stylet Placement Confirmation: breath sounds checked- equal and bilateral,  CO2 detector,  positive ETCO2 and ETT inserted through vocal cords under direct vision Secured at: 21 cm Tube secured with: Tape Dental Injury: Teeth and Oropharynx as per pre-operative assessment

## 2019-05-30 NOTE — Progress Notes (Signed)
Day of Surgery Procedure(s) (LRB): TOTAL LAPAROSCOPIC HYSTERECTOMY WITH LEFT SALPINGECTOMY/POSSIBLE LSO W/COLLECTION OF PELVIS WASHINGS/ks (N/A) CYSTOSCOPY and Transurethral Resection of a Bladder Tumor (N/A)  Subjective: Patient reports incisional pain, tolerating PO and no problems voiding.   Taking Percocet and Motrin for pain.  Ambulating.   She was seen by anesthesiology early this afternoon due to facial swelling and crepitus up to her ribs post post op. She is feeling better now.   She would like discharge to home.    Patient spoke with Dr. Lovena Neighbours from urology regarding the resection of her bladder tumor which was an incidental finding at the time of routine cystoscopy following her hysterectomy.   Objective: I have reviewed patient's vital signs, intake and output and labs. Vitals:   05/30/19 1300 05/30/19 1400  BP: 127/78 106/61  Pulse: 82 74  Resp: 16 18  Temp: 98.7 F (37.1 C) 98.3 F (36.8 C)  SpO2: 99% 97%   I/O - 2440 cc/725 cc.   CBC    Component Value Date/Time   WBC 12.3 (H) 05/30/2019 1545   RBC 4.29 05/30/2019 1545   HGB 13.1 05/30/2019 1545   HGB 13.6 02/02/2019 1355   HCT 41.2 05/30/2019 1545   HCT 41.0 02/02/2019 1355   PLT 211 05/30/2019 1545   PLT 271 02/02/2019 1355   MCV 96.0 05/30/2019 1545   MCV 94 02/02/2019 1355   MCH 30.5 05/30/2019 1545   MCHC 31.8 05/30/2019 1545   RDW 13.2 05/30/2019 1545   RDW 13.0 02/02/2019 1355   LYMPHSABS 3.0 05/10/2018 1326   MONOABS 0.4 12/29/2008 2101   EOSABS 0.5 (H) 05/10/2018 1326   BASOSABS 0.1 05/10/2018 1326    General: alert and cooperative Resp: clear to auscultation bilaterally Cardio: regular rate and rhythm, S1, S2 normal, no murmur, click, rub or gallop GI: soft, non-tender; bowel sounds normal; no masses,  no organomegaly, incision: clean, dry and intact and minor crepitus of the anterior abdominal wall on the right. Extremities: extremities normal, atraumatic, no cyanosis or edema and DPs  2+ bilaterally.  Vaginal Bleeding: minimal  Assessment: s/p Procedure(s) with comments: TOTAL LAPAROSCOPIC HYSTERECTOMY WITH LEFT SALPINGECTOMY/POSSIBLE LSO W/COLLECTION OF PELVIS WASHINGS/ks (N/A) - left salpingectomy, possible LSO, collection of pelvic washings/ks CYSTOSCOPY and Transurethral Resection of a Bladder Tumor (N/A): progressing well  Plan: Discharge home  Instructions given in verbal and written form.  Rx for Percocet and Motrin.  Decreased activity.  Surgical findings and procedure reviewed.   FU in one week.    LOS: 0 days    Arloa Koh 05/30/2019, 5:48 PM

## 2019-05-30 NOTE — Transfer of Care (Signed)
Immediate Anesthesia Transfer of Care Note  Patient: Anna Gonzalez  Procedure(s) Performed: TOTAL LAPAROSCOPIC HYSTERECTOMY WITH LEFT SALPINGECTOMY/POSSIBLE LSO W/COLLECTION OF PELVIS WASHINGS/ks (N/A Abdomen) CYSTOSCOPY and TURBT (N/A Bladder)  Patient Location: PACU  Anesthesia Type:General  Level of Consciousness: awake, alert , oriented and patient cooperative  Airway & Oxygen Therapy: Patient Spontanous Breathing and Patient connected to nasal cannula oxygen  Post-op Assessment: Report given to RN and Post -op Vital signs reviewed and stable  Post vital signs: Reviewed and stable  Last Vitals:  Vitals Value Taken Time  BP 113/81 05/30/19 1128  Temp    Pulse 96 05/30/19 1128  Resp 14 05/30/19 1128  SpO2 100 % 05/30/19 1128  Vitals shown include unvalidated device data.  Last Pain:  Vitals:   05/30/19 0553  TempSrc:   PainSc: 0-No pain      Patients Stated Pain Goal: 5 ([id number])  Complications: No apparent anesthesia complications

## 2019-05-30 NOTE — Progress Notes (Addendum)
Patient sounds congested and face looks more swollen than is typical for post-surgery.  Family at bedside commented that her face looks fat and is significantly larger than her norm.  Patient with what appears to be subcutaneous emphysema and crepitus from anterior ribs up into face and neck. VSS, O2 saturation 97% on room air.  No c/o difficulty breathing and although I commented that she sounds congested, she denies any feelings of congestion.    Dr. Elza Rafter office called.  She is in a procedure with a patient and will call me back.  Dr Roanna Banning also notified and because patient is stable and in no apparent distress, he deferred to Dr. Quincy Simmonds.

## 2019-05-30 NOTE — Op Note (Signed)
OPERATIVE REPORT     PREOPERATIVE DIAGNOSIS:   Symptomatic fibroids, left adnexal mass.   POSTOPERATIVE DIAGNOSIS:     Symptomatic fibroids, enlongated left tubo-ovarian complex, abdominal omental adhesions, bladder tumor.   PROCEDURES:  Total laparoscopic hysterectomy with left salpingo-oophorectomy, lysis of adhesions, cystoscopy with TURBT (Transurethral Resection of Bladder Tumor)   SURGEON:   Lenard Galloway, M.D.   ASSISTANT:   Dorothy Spark, M.D.  INTRAOPERATIVE CONSULT:  Ellison Hughs, MD - Urologist for TURBT.   ANESTHESIA:  General endotracheal,  local with 0.25% Marcaine.   IVF:  1300 cc LR.   ESTIMATED BLOOD LOSS:  25 cc.   URINE OUTPUT:  300 cc.   COMPLICATIONS:  None.   INDICATIONS FOR THE PROCEDURE:      The patient is a 44 year old G79P3 Caucasian female, status post bilateral tubal ligation and status post right salpingo-oophorectomy for a dermoid cyst who presented with abnormal uterine bleeding and fibroids.  Pelvic ultrasound confirmed fibroids and she was noted to have a complex left adnexal mass which with follow up revealed a left hydrosalpinx.  Her pelvic pain and bleeding were controlled with progesterone therapy.   She was seen by urology for potential interstitial cystitis and a bladder polyp.  Her hysterectomy surgery was delayed due to the pandemic.  She declines future childbearing, and a plan is now made to proceed with a total laparoscopic hysterectomy with left salpingo-oophorectomy, collection of pelvic washings, and cystoscopy after risks, benefits, and alternatives are reviewed.  She is very clear with her request to remove the left ovary due to chronic pain.    FINDINGS:      Laparoscopy revealed a bulky uterus, absent right adnexal and an elongated left tuboovarian complex.  The upper abdomen demonstrated a normal gall bladder and liver.  There were minimal adhesions of the omentum to the left anterior mid abdominal wall.  No endometriosis was  seen in the abdomen or pelvis.   Cystoscopy at the termination of the procedure visualized the bladder throughout 360 degrees including the bladder dome and trigone. There was no evidence of any foreign body in the bladder or the urethra. There was an exophytic bladder tumor of the right side of the bladder just lateral and anterior to right ureter.   Both of the ureters were noted to be patent bilaterally.   SPECIMENS:      The uterus, cervix, and the left tube and ovary were went to pathology separately from the pelvic washings.  Dr. Lovena Neighbours sent the bladder wall specimens pathology separately.     DESCRIPTION OF PROCEDURE:     The patient was reidentified in the preoperative hold area.   She did receive  Cefotetan IV for antibiotic prophylaxis.  She received Lovenox, TED hose, and PAS stockings for DVT prophylaxis.   In the operating room, the patient was placed in the dorsal lithotomy position on the operating room table.  Her legs were placed in the Shelley stirrups and her arms were both tucked at her sides. The patient received general endotracheal anesthesia. The abdomen and vagina were then sterilely prepped and she was sterilely draped.   A speculum was placed in the vagina and a single-tooth tenaculum was placed on the anterior cervical lip.  A figure-of-eight suture of 0 Vicryl was placed on each the anterior and the posterior cervical lips. The uterus was sounded to 8 cm.  The cervix was then dilated with [facility] dilators.  A # 8 RUMI tip with  a KOH ring was then placed through the cervix and into the uterine cavity without difficulty.  The remaining vaginal instruments were then removed.  A Foley catheter was placed inside the bladder.   Attention was turned to the abdomen where the umbilical region was injected with 0.25% Marcaine and a small incision created.  A Veress needle was then used to insufflate the abdomen with CO2 gas after a saline drop test was performed and the fluid flowed  freely.  A 5 mm umbilical incision was created with a scalpel after the skin.  Towel clips were used to raise the anterior abdominal wall, and a 5 mm camera port was then placed using the Optiview.  5 mm incisions were then created in the left upper abdomen and the left lower abdomen after the skin was injected locally with 0.25% Marcaine.  The 5 mm trocars were then placed under visualization of the laparoscope.  An 8 mm trocar was placed in the right lower quadrant after injecting with Marcaine and incising with a scalpel.   The Harmonic Scalpel was used to lyse the omental adhesions to the left mid abdominal wall.  Hemostasis was good.    The patient was placed in Trendelenburg position.  An inspection of the abdomen and pelvis was performed. The findings are as noted above.    The right ureter was identified, and the left ureter could not be definitively identified.  The left infundibulopelvic ligament was grasped close to the ovary, and the Ligasure was used to cauterize and cut through the pedicle.  The dissection continued through the left broad ligament using the same instrument.   The left round ligament was then cauterized and divided with same instrument.  The proximal fallopian tube and the utero-ovarian ligament were then cauterized and cut, and the left adnexa was placed in the cul de sac.   Dissection was performed to the anterior and posterior leaves of the broad ligament using the Harmonic scalpel. The incision was carried across the anterior cul- de-sac along the vesicouterine fold and the bladder was dissected away from the cervix using Harmonic scalpel. The right round ligament was grasped with the Ligasure, and it was cauterized and divided.  The anterior and posterior leaves of the broad ligament were dissected with the Harmonic scalpel.  There were adhesions between the lower uterine segment and her bladder from her prior Cesarean Section, and the bladder was retrograde filled to outline  the bladder.  The Harmonic scalpel was used to take down the adhesions.  The uterine arteries were skeletonized at using the Harmonic scalpel. They were then cauterized and cut with the Ligasure instrument.   The KOH ring was nicely visible.  The colpotomy incision was performed with the Harmonic Scalpel in a circumferential fashion. The uterine specimen and the left tube and ovary were removed from the peritoneal cavity and was sent to Pathology.  The balloon occluder was placed in the vagina.  The vaginal cuff was sutured using two running sutures of 0 V-Loc due to the thickness of the vagina.  The vagina was closed from the patient's right hand side to the left hand side and then back 3 sutures towards the midline.  This provided good full-thickness closure of the vaginal cuff.  The laparoscopic needles for suturing were each removed from the peritoneal cavity as they were used.   The pelvis was irrigated and suctioned.   The pneumoperitoneal was let down.  There was good hemostasis of the operative sites  and pedicles.  Arista was placed intraperitoneally over the surgical beds.     The laparoscopic trocars were removed after the CO2 pneumoperitoneum was released.  The fascia of the right lower quadrant incision was closed with a figure of eight suture of  0/0 Vicryl.  All skin incisions were closed with subcuticular sutures of 4-0 Vicryl.  Dermabond was placed over the incisions.  The Foley catheter was removed and cystoscopy was performed and the findings are as noted above.  The Foley catheter was left out.   Please refer to Dr. Jackson Latino dictation separately for the resection of the bladder tumor.    This concluded the patient's procedure.  She was extubated and escorted to the recovery room in stable and awake condition.  There were no complications to the procedure.  All needle, instrument, and sponge counts were correct.    Lenard Galloway, M.D.

## 2019-05-30 NOTE — Discharge Instructions (Signed)

## 2019-05-30 NOTE — Progress Notes (Signed)
Update to History and Physical  No marked change in status since office preop visit.  Her mammogram was updated and was normal. Patient examined.  OK to proceed with surgery.

## 2019-05-31 LAB — SURGICAL PATHOLOGY

## 2019-05-31 LAB — CYTOLOGY - NON PAP

## 2019-05-31 NOTE — Anesthesia Postprocedure Evaluation (Signed)
Anesthesia Post Note  Patient: Anna Gonzalez  Procedure(s) Performed: TOTAL LAPAROSCOPIC HYSTERECTOMY WITH LEFT SALPINGECTOMY/POSSIBLE LSO W/COLLECTION OF PELVIS WASHINGS/ks (N/A Abdomen) CYSTOSCOPY and Transurethral Resection of a Bladder Tumor (N/A Bladder)     Patient location during evaluation: PACU Anesthesia Type: General Level of consciousness: awake and alert Pain management: pain level controlled Vital Signs Assessment: post-procedure vital signs reviewed and stable Respiratory status: spontaneous breathing, nonlabored ventilation, respiratory function stable and patient connected to nasal cannula oxygen Cardiovascular status: blood pressure returned to baseline and stable Postop Assessment: no apparent nausea or vomiting Anesthetic complications: no    Last Vitals:  Vitals:   05/30/19 1400 05/30/19 1800  BP: 106/61 110/75  Pulse: 74   Resp: 18 16  Temp: 36.8 C 37.2 C  SpO2: 97% 97%    Last Pain:  Vitals:   05/30/19 1800  TempSrc:   PainSc: 2                  Isra Lindy

## 2019-06-01 ENCOUNTER — Encounter: Payer: Self-pay | Admitting: Obstetrics and Gynecology

## 2019-06-05 NOTE — Discharge Summary (Signed)
Physician Discharge Summary  Patient ID: Anna Gonzalez MRN: [id number] DOB/AGE: October 12, 1975 44 y.o.  Admit date: 05/30/2019 Discharge date:  05/30/2019. Admission Diagnoses: 1.  Symptomatic fibroids.  2.   Left adnexal mass.  Discharge Diagnoses:  1.  Symptomatic fibroids.  2.   Elongated left tubo-ovarian complex.   3.   Abdominal omental adhesions.  4.   Bladder tumor. 5.   Status post total laparoscopic hysterectomy with left salpingo-oophorectomy, lysis of adhesions, cystoscopy with TURBT (Transurethral Resection of Bladder Tumor)  Active Problems:   Fibroids   Status post laparoscopic hysterectomy   Discharged Condition: good  Hospital Course: The patient was admitted on 05/30/19 for a total laparoscopic hysterectomy with left salpingo-oophorectomy, lysis of adhesions, and cystoscopy.  Intraoperatively, she was noted to have a bladder tumor, and urology was consulted while the patient was under general anesthesia.  She had previously had a cystoscopy by urology for potential interstitial cystitis.  Patient underwent a TURBT (Transurethral Resection of Bladder Tumor) by Dr. Ellison Hughs from [facility], who consulted intraoperatively.  Her surgery was performed without complication while under general anesthesia.  The patient's post op course was notable for some facial swelling and subcutaneous crepitus that quickly diminished within hours of finishing her surgery.  She began tolerating oral intake and her pain medication was converted to Percocet and Motrin.  She ambulated independently and wore PAS and Ted hose for DVT prophylaxis while in bed.  She received Lovenox preop.  Her foley catheter were removed at the termination of her procedure, and she was able to voided post op. The patient's vital signs remained stable.  The patient's post op day zero Hgb was 13.1.  She was tolerating the this well.  She had very minimal vaginal bleeding, and her incisions demonstrated no signs  of erythema or significant drainage.  She was found to be in good condition and ready for discharge on post op day zero.  Her surgical findings and procedure were discussed with her post op.  She expressed appreciation for the resection of the bladder tumor while she was under anesthesia.   Consults: urology - Dr. Ellison Hughs for intraop and post op consultation.   Significant Diagnostic Studies:  See Hospital Course.  Treatments: surgery:  Total laparoscopic hysterectomy with left salpingo-oophorectomy, lysis of adhesions, cystoscopy with TURBT (Transurethral Resection of Bladder Tumor)  Discharge Exam: Blood pressure 110/75, pulse 74, temperature 99 F (37.2 C), resp. rate 16, height 5\' 3"  (1.6 m), weight 60.5 kg, SpO2 97 %.   I/O - 2440 cc/725 cc.   General: alert and cooperative Resp: clear to auscultation bilaterally Cardio: regular rate and rhythm, S1, S2 normal, no murmur, click, rub or gallop GI: soft, non-tender; bowel sounds normal; no masses,  no organomegaly, incision: clean, dry and intact and minor crepitus of the anterior abdominal wall on the right. Extremities: extremities normal, atraumatic, no cyanosis or edema and DPs 2+ bilaterally.  Vaginal Bleeding: minimal   Disposition:   Patient was discharged to home in good condition.   Post op instructions were reviewed in verbal and written form.    Allergies as of 05/30/2019       Reactions   Zoloft [sertraline Hcl] Other (See Comments)   Did not "feel right"        Medication List     STOP taking these medications    naproxen sodium 220 MG tablet Commonly known as: ALEVE       TAKE these medications  clonazePAM 1 MG tablet Commonly known as: KLONOPIN 1/2-1 po BID prn anxiety What changed:  how much to take how to take this when to take this reasons to take this additional instructions   ibuprofen 800 MG tablet Commonly known as: ADVIL Take 1 tablet (800 mg total) by mouth every 8  (eight) hours as needed for mild pain.   oxyCODONE-acetaminophen 5-325 MG tablet Commonly known as: PERCOCET/ROXICET Take 1-2 tablets by mouth every 4 (four) hours as needed for moderate pain.       Follow-up Information     Ceasar Mons, MD In 2 weeks.   Specialty: Urology Why: My office will call to arrange your follow-up appointment Contact information: [address] [place] [address] [phone]         Nunzio Cobbs, MD In 1 week.   Specialty: Obstetrics and Gynecology Contact information: [address] [place] [address] [phone]            Signed: Arloa Koh 06/05/2019, 8:21 PM

## 2019-06-07 ENCOUNTER — Ambulatory Visit (INDEPENDENT_AMBULATORY_CARE_PROVIDER_SITE_OTHER): Payer: 59 | Admitting: Obstetrics and Gynecology

## 2019-06-07 ENCOUNTER — Encounter: Payer: Self-pay | Admitting: Obstetrics and Gynecology

## 2019-06-07 ENCOUNTER — Other Ambulatory Visit: Payer: Self-pay

## 2019-06-07 VITALS — BP 108/72 | HR 76 | Temp 97.0°F | Ht 63.75 in | Wt 136.0 lb

## 2019-06-07 DIAGNOSIS — Z9889 Other specified postprocedural states: Secondary | ICD-10-CM

## 2019-06-07 DIAGNOSIS — E8940 Asymptomatic postprocedural ovarian failure: Secondary | ICD-10-CM

## 2019-06-07 MED ORDER — ESTRADIOL 1 MG PO TABS: 1.0000 mg | ORAL_TABLET | Freq: Every day | ORAL | 0 refills | Status: DC

## 2019-06-07 NOTE — Progress Notes (Signed)
GYNECOLOGY  VISIT   HPI: 44 y.o.   Divorced  Caucasian  female   [phone] with Patient's last menstrual period was 10/30/2018 (approximate).   here for 1 week follow up TOTAL LAPAROSCOPIC HYSTERECTOMY WITH LEFT SALPINGECTOMY/POSSIBLE LSO W/COLLECTION OF PELVIS WASHINGS/ks (N/A Abdomen)  CYSTOSCOPY and Transurethral Resection of a Bladder Tumor (N/A Bladder).  Took Ibuprofen this am for pain.  Not using the percocet at this point.   Had 2 BMs 5 days ago.  States this is normal for her.  She uses Colace usually.   Voiding well.  Some blood in the urine.  She passed a small clot this am.  She will see urology in 5 days for her first post op visit.   No vaginal bleeding.   Walking in the driveway.   Had one or two hot flashes.  No night sweats.   GYNECOLOGIC HISTORY: Patient's last menstrual period was 10/30/2018 (approximate). Contraception:  Tubal Menopausal hormone therapy: NA. Last mammogram: 05-25-19 3D/Neg/density C/Birads1 Last pap smear: 02-02-19 Neg:Neg HR HPV, 12/16/17 ASCUS:NEG HR HPV; 08/12/13 Neg:Neg HR HPV        OB History    Gravida  4   Para  3   Term  0   Preterm  0   AB  1   Living  3     SAB  1   TAB  0   Ectopic  0   Multiple  0   Live Births  0              Patient Active Problem List   Diagnosis Date Noted  . Status post laparoscopic hysterectomy 05/30/2019  . Fibroids 02/02/2019  . Hydrosalpinx 02/02/2019  . Bilateral breast cysts 04/08/2018  . Depression, major, single episode, moderate ([place]) 01/23/2018  . Generalized anxiety disorder 01/23/2018  . Adnexal cyst   . Female bladder prolapse 12/19/2017  . Genuine stress incontinence, female 12/19/2017  . Anxiety as acute reaction to exceptional stress 02/05/2015  . Mixed incontinence urge and stress 09/01/2013  . Dyspareunia 09/01/2013  . Menorrhagia 08/12/2013  . Dysmenorrhea 08/12/2013  . Back pain 01/06/2011  . Neck pain 01/06/2011  . NICOTINE ADDICTION 12/28/2008  .  FATIGUE 12/28/2008  . UNSPECIFIED URINARY INCONTINENCE 12/28/2008    Past Medical History:  Diagnosis Date  . Anxiety   . Breast nodule 08/12/2013  . COVID-19 09/2018  . Depression   . Dysmenorrhea 08/12/2013  . Fibroid   . History of kidney stones   . Menorrhagia 08/12/2013  . Ovarian cyst    left - 68 x 36 x 22 mm, complex, CA125 40  . UI (urinary incontinence) 08/12/2013  . Urothelial cancer ([place]) 05/2019    Past Surgical History:  Procedure Laterality Date  . APPENDECTOMY    . BREAST CYST EXCISION    . CESAREAN SECTION    . CYSTOSCOPY N/A 05/30/2019   Procedure: CYSTOSCOPY and Transurethral Resection of a Bladder Tumor;  Surgeon: Nunzio Cobbs, MD;  Location: [facility];  Service: Gynecology;  Laterality: N/A;  . OOPHORECTOMY Right   . TOTAL LAPAROSCOPIC HYSTERECTOMY WITH SALPINGECTOMY N/A 05/30/2019   Procedure: TOTAL LAPAROSCOPIC HYSTERECTOMY WITH LEFT SALPINGECTOMY/POSSIBLE LSO W/COLLECTION OF PELVIS WASHINGS/ks;  Surgeon: Nunzio Cobbs, MD;  Location: [facility];  Service: Gynecology;  Laterality: N/A;  left salpingectomy, possible LSO, collection of pelvic washings/ks  . TUBAL LIGATION      Current Outpatient Medications  Medication Sig Dispense Refill  .  clonazePAM (KLONOPIN) 1 MG tablet 1/2-1 po BID prn anxiety (Patient taking differently: Take 0.5-1 mg by mouth 2 (two) times daily as needed for anxiety. ) 30 tablet 3  . ibuprofen (ADVIL) 800 MG tablet Take 1 tablet (800 mg total) by mouth every 8 (eight) hours as needed for mild pain. 30 tablet 0  . oxyCODONE-acetaminophen (PERCOCET/ROXICET) 5-325 MG tablet Take 1-2 tablets by mouth every 4 (four) hours as needed for moderate pain. 30 tablet 0   No current facility-administered medications for this visit.     ALLERGIES: Zoloft [sertraline hcl]  Family History  Problem Relation Age of Onset  . Dementia Father   . Bipolar disorder Sister   . Cancer Maternal  Grandmother        breast  . Stroke Maternal Grandfather   . Alzheimer's disease Paternal Grandmother   . Heart attack Paternal Grandfather     Social History   Socioeconomic History  . Marital status: Divorced    Spouse name: Not on file  . Number of children: Not on file  . Years of education: Not on file  . Highest education level: Not on file  Occupational History  . Not on file  Tobacco Use  . Smoking status: Current Every Day Smoker    Packs/day: 1.00  . Smokeless tobacco: Never Used  Substance and Sexual Activity  . Alcohol use: Yes    Alcohol/week: 2.0 standard drinks    Types: 2 Glasses of wine per week    Comment: occasionally  . Drug use: Never  . Sexual activity: Yes    Birth control/protection: None, Surgical  Other Topics Concern  . Not on file  Social History Narrative  . Not on file   Social Determinants of Health   Financial Resource Strain:   . Difficulty of Paying Living Expenses:   Food Insecurity:   . Worried About Charity fundraiser in the Last Year:   . Arboriculturist in the Last Year:   Transportation Needs:   . Film/video editor (Medical):   Marland Kitchen Lack of Transportation (Non-Medical):   Physical Activity:   . Days of Exercise per Week:   . Minutes of Exercise per Session:   Stress:   . Feeling of Stress :   Social Connections:   . Frequency of Communication with Friends and Family:   . Frequency of Social Gatherings with Friends and Family:   . Attends Religious Services:   . Active Member of Clubs or Organizations:   . Attends Archivist Meetings:   Marland Kitchen Marital Status:   Intimate Partner Violence:   . Fear of Current or Ex-Partner:   . Emotionally Abused:   Marland Kitchen Physically Abused:   . Sexually Abused:     Review of Systems  All other systems reviewed and are negative.   PHYSICAL EXAMINATION:    BP 108/72   Pulse 76   Temp (!) 97 F (36.1 C) (Temporal)   Ht 5' 3.75" (1.619 m)   Wt 136 lb (61.7 kg)   LMP  10/30/2018 (Approximate)   BMI 23.53 kg/m     General appearance: alert, cooperative and appears stated age   Abdomen: incisions intact, abdomen is soft, non-tender, no masses, no organomegaly   Pelvic: deferred.   ASSESSMENT  Status post total laparoscopic hysterectomy with LSO, LOA, and TURB.  Doing well post op.  Surgical menopause.  PLAN  Start Estrace 1 mg daily.   We discussed risks of PE, DVT,  stroke.  We discussed alternatives of Gabapentin and SSRI/SNRIs, which she declines.  Fu for 6 week post op check, around April 12 or 13.    An After Visit Summary was printed and given to the patient.

## 2019-06-13 DIAGNOSIS — C670 Malignant neoplasm of trigone of bladder: Secondary | ICD-10-CM | POA: Diagnosis not present

## 2019-06-13 DIAGNOSIS — R300 Dysuria: Secondary | ICD-10-CM | POA: Diagnosis not present

## 2019-06-13 DIAGNOSIS — R8271 Bacteriuria: Secondary | ICD-10-CM | POA: Diagnosis not present

## 2019-06-13 MED FILL — URO-MP CAPSULE: 118 | 10 days supply | Qty: 30 | Fill #0

## 2019-06-20 DIAGNOSIS — C679 Malignant neoplasm of bladder, unspecified: Secondary | ICD-10-CM | POA: Diagnosis not present

## 2019-06-20 DIAGNOSIS — C670 Malignant neoplasm of trigone of bladder: Secondary | ICD-10-CM | POA: Diagnosis not present

## 2019-07-01 ENCOUNTER — Other Ambulatory Visit: Payer: Self-pay

## 2019-07-03 ENCOUNTER — Telehealth: Payer: Self-pay | Admitting: *Deleted

## 2019-07-03 NOTE — Telephone Encounter (Signed)
Call to patient. MD has family emergency and will need to reschedule post op. Needs office visit to return to work. Post op check scheduled with Dr Talbert Nan, surgical assistant, for 07-07-19.  Aware may still need to retrunt o see Dr Quincy Simmonds.  Routing to Dr Talbert Nan, covering provider.  Encounter closed.

## 2019-07-04 ENCOUNTER — Ambulatory Visit: Payer: 59 | Admitting: Obstetrics and Gynecology

## 2019-07-07 ENCOUNTER — Encounter: Payer: Self-pay | Admitting: Obstetrics and Gynecology

## 2019-07-07 ENCOUNTER — Other Ambulatory Visit: Payer: Self-pay

## 2019-07-07 ENCOUNTER — Ambulatory Visit (INDEPENDENT_AMBULATORY_CARE_PROVIDER_SITE_OTHER): Payer: 59 | Admitting: Obstetrics and Gynecology

## 2019-07-07 VITALS — BP 112/76 | HR 84 | Temp 96.9°F | Ht 63.75 in | Wt 133.0 lb

## 2019-07-07 DIAGNOSIS — Z90710 Acquired absence of both cervix and uterus: Secondary | ICD-10-CM

## 2019-07-07 NOTE — Progress Notes (Signed)
GYNECOLOGY  VISIT   HPI: 44 y.o.   Divorced White or Caucasian Not Hispanic or Latino  female   [phone] with Patient's last menstrual period was 10/30/2018 (approximate).   here for 6 week follow up TOTAL LAPAROSCOPIC HYSTERECTOMY WITH LEFT SALPINGECTOMY/POSSIBLE LSO W/COLLECTION OF PELVIS WASHINGS/ks (N/A Abdomen)  CYSTOSCOPY and Transurethral Resection of a Bladder Tumor (N/A Bladder). H/O right oophorectomy.  GYN pathology was benign. Bladder resection with low grade papillary urothelial carcinoma without evidence of invasion. She was started on Estrace 1 mg po at her visit last month secondary to surgical menopause.  No significant vasomotor symptoms. She is doing well, occasionally discomfort when she voids. She is seeing Urology next week.  Bowels are working fine.   GYNECOLOGIC HISTORY: Patient's last menstrual period was 10/30/2018 (approximate). Contraception:Tubal/Hyst Menopausal hormone therapy: Estradiol 1mg         OB History    Gravida  4   Para  3   Term  0   Preterm  0   AB  1   Living  3     SAB  1   TAB  0   Ectopic  0   Multiple  0   Live Births  0              Patient Active Problem List   Diagnosis Date Noted  . Status post laparoscopic hysterectomy 05/30/2019  . Fibroids 02/02/2019  . Hydrosalpinx 02/02/2019  . Bilateral breast cysts 04/08/2018  . Depression, major, single episode, moderate ([place]) 01/23/2018  . Generalized anxiety disorder 01/23/2018  . Adnexal cyst   . Female bladder prolapse 12/19/2017  . Genuine stress incontinence, female 12/19/2017  . Anxiety as acute reaction to exceptional stress 02/05/2015  . Mixed incontinence urge and stress 09/01/2013  . Dyspareunia 09/01/2013  . Menorrhagia 08/12/2013  . Dysmenorrhea 08/12/2013  . Back pain 01/06/2011  . Neck pain 01/06/2011  . NICOTINE ADDICTION 12/28/2008  . FATIGUE 12/28/2008  . UNSPECIFIED URINARY INCONTINENCE 12/28/2008    Past Medical History:  Diagnosis Date   . Anxiety   . Breast nodule 08/12/2013  . COVID-19 09/2018  . Depression   . Dysmenorrhea 08/12/2013  . Fibroid   . History of kidney stones   . Menorrhagia 08/12/2013  . Ovarian cyst    left - 68 x 36 x 22 mm, complex, CA125 40  . UI (urinary incontinence) 08/12/2013  . Urothelial cancer ([place]) 05/2019    Past Surgical History:  Procedure Laterality Date  . APPENDECTOMY    . BREAST CYST EXCISION    . CESAREAN SECTION    . CYSTOSCOPY N/A 05/30/2019   Procedure: CYSTOSCOPY and Transurethral Resection of a Bladder Tumor;  Surgeon: Nunzio Cobbs, MD;  Location: [facility];  Service: Gynecology;  Laterality: N/A;  . OOPHORECTOMY Right   . TOTAL LAPAROSCOPIC HYSTERECTOMY WITH SALPINGECTOMY N/A 05/30/2019   Procedure: TOTAL LAPAROSCOPIC HYSTERECTOMY WITH LEFT SALPINGECTOMY/POSSIBLE LSO W/COLLECTION OF PELVIS WASHINGS/ks;  Surgeon: Nunzio Cobbs, MD;  [facility];  Service: Gynecology;  Laterality: N/A;  left salpingectomy, possible LSO, collection of pelvic washings/ks  . TUBAL LIGATION      Current Outpatient Medications  Medication Sig Dispense Refill  . Meth-Hyo-M Bl-Na Phos-Ph Sal (URO-MP PO) Take 1 tablet by mouth as needed.    . clonazePAM (KLONOPIN) 1 MG tablet 1/2-1 po BID prn anxiety (Patient taking differently: Take 0.5-1 mg by mouth 2 (two) times daily as needed for anxiety. )  30 tablet 3  . estradiol (ESTRACE) 1 MG tablet Take 1 tablet (1 mg total) by mouth daily. 90 tablet 0   No current facility-administered medications for this visit.     ALLERGIES: Zoloft [sertraline hcl]  Family History  Problem Relation Age of Onset  . Dementia Father   . Bipolar disorder Sister   . Cancer Maternal Grandmother        breast  . Stroke Maternal Grandfather   . Alzheimer's disease Paternal Grandmother   . Heart attack Paternal Grandfather     Social History   Socioeconomic History  . Marital status: Divorced     Spouse name: Not on file  . Number of children: Not on file  . Years of education: Not on file  . Highest education level: Not on file  Occupational History  . Not on file  Tobacco Use  . Smoking status: Current Every Day Smoker    Packs/day: 1.00  . Smokeless tobacco: Never Used  Substance and Sexual Activity  . Alcohol use: Yes    Alcohol/week: 2.0 standard drinks    Types: 2 Glasses of wine per week    Comment: occasionally  . Drug use: Never  . Sexual activity: Yes    Birth control/protection: None, Surgical  Other Topics Concern  . Not on file  Social History Narrative  . Not on file   Social Determinants of Health   Financial Resource Strain:   . Difficulty of Paying Living Expenses:   Food Insecurity:   . Worried About Charity fundraiser in the Last Year:   . Arboriculturist in the Last Year:   Transportation Needs:   . Film/video editor (Medical):   Marland Kitchen Lack of Transportation (Non-Medical):   Physical Activity:   . Days of Exercise per Week:   . Minutes of Exercise per Session:   Stress:   . Feeling of Stress :   Social Connections:   . Frequency of Communication with Friends and Family:   . Frequency of Social Gatherings with Friends and Family:   . Attends Religious Services:   . Active Member of Clubs or Organizations:   . Attends Archivist Meetings:   Marland Kitchen Marital Status:   Intimate Partner Violence:   . Fear of Current or Ex-Partner:   . Emotionally Abused:   Marland Kitchen Physically Abused:   . Sexually Abused:     Review of Systems  All other systems reviewed and are negative.   PHYSICAL EXAMINATION:    BP 112/76   Pulse 84   Temp (!) 96.9 F (36.1 C) (Temporal)   Ht 5' 3.75" (1.619 m)   Wt 133 lb (60.3 kg)   LMP 10/30/2018 (Approximate)   BMI 23.01 kg/m     General appearance: alert, cooperative and appears stated age Abdomen: soft, non-tender; non distended, no masses,  no organomegaly  Pelvic: External genitalia:  no lesions               Urethra:  normal appearing urethra with no masses, tenderness or lesions              Bartholins and Skenes: normal                 Vagina: normal appearing vagina with normal color and discharge, no lesions. Vaginal cuff is healing well.               Cervix: absent  Bimanual Exam:  Uterus:  uterus absent              Adnexa: no mass, fullness, tenderness               Chaperone was present for exam.  ASSESSMENT 6 weeks s/p TLH/LSO with benign pathology Surgical menopause, doing well on oral ERT Bladder cancer diagnosed at treated at the time of her hysterectomy.    PLAN No intercourse until 12 weeks post op F/U with Urology as directed F/U with Dr Quincy Simmonds in 11/21 for an annual exam She can return to work

## 2019-07-11 ENCOUNTER — Ambulatory Visit: Payer: 59 | Admitting: Obstetrics and Gynecology

## 2019-07-14 MED FILL — ESTRADIOL 1 MG TABS: 1 | 60 days supply | Qty: 60 | Fill #0

## 2019-07-15 DIAGNOSIS — C670 Malignant neoplasm of trigone of bladder: Secondary | ICD-10-CM | POA: Diagnosis not present

## 2019-07-22 DIAGNOSIS — Z5111 Encounter for antineoplastic chemotherapy: Secondary | ICD-10-CM | POA: Diagnosis not present

## 2019-07-22 DIAGNOSIS — C670 Malignant neoplasm of trigone of bladder: Secondary | ICD-10-CM | POA: Diagnosis not present

## 2019-07-29 DIAGNOSIS — R300 Dysuria: Secondary | ICD-10-CM | POA: Diagnosis not present

## 2019-08-05 DIAGNOSIS — R300 Dysuria: Secondary | ICD-10-CM | POA: Diagnosis not present

## 2019-08-05 DIAGNOSIS — C670 Malignant neoplasm of trigone of bladder: Secondary | ICD-10-CM | POA: Diagnosis not present

## 2019-08-05 DIAGNOSIS — Z5111 Encounter for antineoplastic chemotherapy: Secondary | ICD-10-CM | POA: Diagnosis not present

## 2019-08-10 MED FILL — URO-MP CAPSULE: 118 | 10 days supply | Qty: 30 | Fill #1

## 2019-08-12 DIAGNOSIS — C670 Malignant neoplasm of trigone of bladder: Secondary | ICD-10-CM | POA: Diagnosis not present

## 2019-08-12 DIAGNOSIS — Z5111 Encounter for antineoplastic chemotherapy: Secondary | ICD-10-CM | POA: Diagnosis not present

## 2019-08-26 DIAGNOSIS — Z5111 Encounter for antineoplastic chemotherapy: Secondary | ICD-10-CM | POA: Diagnosis not present

## 2019-08-26 DIAGNOSIS — C670 Malignant neoplasm of trigone of bladder: Secondary | ICD-10-CM | POA: Diagnosis not present

## 2019-09-02 DIAGNOSIS — Z5111 Encounter for antineoplastic chemotherapy: Secondary | ICD-10-CM | POA: Diagnosis not present

## 2019-09-02 DIAGNOSIS — C670 Malignant neoplasm of trigone of bladder: Secondary | ICD-10-CM | POA: Diagnosis not present

## 2019-09-29 MED FILL — URO-MP CAPSULE: 118 | 10 days supply | Qty: 30 | Fill #2

## 2019-10-25 MED FILL — URO-MP CAPSULE: 118 | 10 days supply | Qty: 30 | Fill #3

## 2019-11-18 DIAGNOSIS — C670 Malignant neoplasm of trigone of bladder: Secondary | ICD-10-CM | POA: Diagnosis not present

## 2019-11-21 MED FILL — URO-MP CAPSULE: 118 | 10 days supply | Qty: 30 | Fill #4

## 2019-12-23 MED FILL — URO-MP CAPSULE: 118 | 10 days supply | Qty: 30 | Fill #5

## 2020-02-14 ENCOUNTER — Other Ambulatory Visit: Payer: Self-pay

## 2020-02-14 ENCOUNTER — Ambulatory Visit
Admission: EM | Admit: 2020-02-14 | Discharge: 2020-02-14 | Disposition: A | Discharge status: Home / Self Care | Payer: 59 | Attending: Emergency Medicine | Admitting: Emergency Medicine

## 2020-02-14 DIAGNOSIS — H18891 Other specified disorders of cornea, right eye: Secondary | ICD-10-CM | POA: Diagnosis not present

## 2020-02-14 DIAGNOSIS — S0501XA Injury of conjunctiva and corneal abrasion without foreign body, right eye, initial encounter: Secondary | ICD-10-CM

## 2020-02-14 MED ORDER — SYSTANE OVERNIGHT THERAPY 0.3 % OP GEL: Freq: Every evening | OPHTHALMIC | 0 refills | Status: DC | PRN

## 2020-02-14 MED ORDER — POLYMYXIN B-TRIMETHOPRIM 10000-0.1 UNIT/ML-% OP SOLN: OPHTHALMIC | 0 refills | Status: DC

## 2020-02-14 NOTE — ED Triage Notes (Signed)
Pt presents with right eye redness and irritation, may be from soap

## 2020-02-14 NOTE — Discharge Instructions (Signed)
Use polytrim eye drops as prescribed and to completion Systane overnight tears prescribed Use OTC ibuprofen or tylenol as needed for pain relief Return here or follow up with ophthamolgy if symptoms persists or worsen such as fever, chills, redness, swelling, eye pain, painful eye movements, vision changes, etc..Marland Kitchen

## 2020-02-14 NOTE — ED Provider Notes (Signed)
[place]   [id number] 02/14/20 Arrival Time: 6301  CC: Red eye  SUBJECTIVE:  Anna Gonzalez is a 44 y.o. female who presents with complaint of eye redness and discomfort that began 3 days ago.  Speculates she may have scratched her eye or gotten soap in it.  Has tried OTC eye drops without relief.  Denies aggravating factors.  Also mentions discharge.  Denies fever, chills, nausea, vomiting, eye pain, painful eye movements, discharge, itching, vision changes, periorbital erythema.     Denies contact lens use.    ROS: As per HPI.  All other pertinent ROS negative.     Past Medical History:  Diagnosis Date  . Anxiety   . Breast nodule 08/12/2013  . COVID-19 09/2018  . Depression   . Dysmenorrhea 08/12/2013  . Fibroid   . History of kidney stones   . Menorrhagia 08/12/2013  . Ovarian cyst    left - 68 x 36 x 22 mm, complex, CA125 40  . UI (urinary incontinence) 08/12/2013  . Urothelial cancer ([place]) 05/2019   Past Surgical History:  Procedure Laterality Date  . APPENDECTOMY    . BREAST CYST EXCISION    . CESAREAN SECTION    . CYSTOSCOPY N/A 05/30/2019   Procedure: CYSTOSCOPY and Transurethral Resection of a Bladder Tumor;  Surgeon: Nunzio Cobbs, MD;  Location: [facility];  Service: Gynecology;  Laterality: N/A;  . OOPHORECTOMY Right   . TOTAL LAPAROSCOPIC HYSTERECTOMY WITH SALPINGECTOMY N/A 05/30/2019   Procedure: TOTAL LAPAROSCOPIC HYSTERECTOMY WITH LEFT SALPINGECTOMY/POSSIBLE LSO W/COLLECTION OF PELVIS WASHINGS/ks;  Surgeon: Nunzio Cobbs, MD;  Location: [facility];  Service: Gynecology;  Laterality: N/A;  left salpingectomy, possible LSO, collection of pelvic washings/ks  . TUBAL LIGATION     Allergies  Allergen Reactions  . Zoloft [Sertraline Hcl] Other (See Comments)    Did not "feel right"   No current facility-administered medications on file prior to encounter.   Current Outpatient Medications on  File Prior to Encounter  Medication Sig Dispense Refill  . clonazePAM (KLONOPIN) 1 MG tablet 1/2-1 po BID prn anxiety (Patient taking differently: Take 0.5-1 mg by mouth 2 (two) times daily as needed for anxiety. ) 30 tablet 3  . estradiol (ESTRACE) 1 MG tablet Take 1 tablet (1 mg total) by mouth daily. 90 tablet 0  . Meth-Hyo-M Bl-Na Phos-Ph Sal (URO-MP PO) Take 1 tablet by mouth as needed.     Social History   Socioeconomic History  . Marital status: Divorced    Spouse name: Not on file  . Number of children: Not on file  . Years of education: Not on file  . Highest education level: Not on file  Occupational History  . Not on file  Tobacco Use  . Smoking status: Current Every Day Smoker    Packs/day: 1.00  . Smokeless tobacco: Never Used  Vaping Use  . Vaping Use: Never used  Substance and Sexual Activity  . Alcohol use: Yes    Alcohol/week: 2.0 standard drinks    Types: 2 Glasses of wine per week    Comment: occasionally  . Drug use: Never  . Sexual activity: Yes    Birth control/protection: None, Surgical  Other Topics Concern  . Not on file  Social History Narrative  . Not on file   Social Determinants of Health   Financial Resource Strain:   . Difficulty of Paying Living Expenses: Not on file  Food Insecurity:   .  Worried About Charity fundraiser in the Last Year: Not on file  . Ran Out of Food in the Last Year: Not on file  Transportation Needs:   . Lack of Transportation (Medical): Not on file  . Lack of Transportation (Non-Medical): Not on file  Physical Activity:   . Days of Exercise per Week: Not on file  . Minutes of Exercise per Session: Not on file  Stress:   . Feeling of Stress : Not on file  Social Connections:   . Frequency of Communication with Friends and Family: Not on file  . Frequency of Social Gatherings with Friends and Family: Not on file  . Attends Religious Services: Not on file  . Active Member of Clubs or Organizations: Not on file   . Attends Archivist Meetings: Not on file  . Marital Status: Not on file  Intimate Partner Violence:   . Fear of Current or Ex-Partner: Not on file  . Emotionally Abused: Not on file  . Physically Abused: Not on file  . Sexually Abused: Not on file   Family History  Problem Relation Age of Onset  . Dementia Father   . Bipolar disorder Sister   . Cancer Maternal Grandmother        breast  . Stroke Maternal Grandfather   . Alzheimer's disease Paternal Grandmother   . Heart attack Paternal Grandfather     OBJECTIVE:  Vitals:   02/14/20 1623  BP: 115/80  Pulse: 72  Resp: 18  Temp: 98.4 F (36.9 C)  SpO2: 98%    General appearance: alert; no distress Eyes: moderate conjunctival erythema over RT cornea in 6-12 o'clock position. PERRL; EOMI without discomfort;  no obvious drainage; lid everted without obvious FB; obvious fluorescein uptake in 10 o'clock position Neck: supple Lungs: clear to auscultation bilaterally Heart: regular rate and rhythm Skin: warm and dry Psychological: alert and cooperative; normal mood and affect  ASSESSMENT & PLAN:  1. Corneal irritation of right eye   2. Abrasion of right cornea, initial encounter     Meds ordered this encounter  Medications  . trimethoprim-polymyxin b (POLYTRIM) ophthalmic solution    Sig: instill 1 drop of polymyxin B sulfate and trimethoprim sulfate ophthalmic solution (polymyxin B 10000 units/trimethoprim 1 mg per mL) to affected eye(s) every 3 hours for 7 to 10 days    Dispense:  10 mL    Refill:  0    Order Specific Question:   Supervising Provider    Answer:   Raylene Everts [[id number]]  . hypromellose (SYSTANE OVERNIGHT THERAPY) 0.3 % GEL ophthalmic ointment    Sig: Place into the right eye at bedtime as needed for dry eyes.    Dispense:  10 g    Refill:  0    Order Specific Question:   Supervising Provider    Answer:   Raylene Everts [[id number]]   Use polytrim eye drops as prescribed and to  completion Systane overnight tears prescribed Use OTC ibuprofen or tylenol as needed for pain relief Return here or follow up with ophthamolgy if symptoms persists or worsen such as fever, chills, redness, swelling, eye pain, painful eye movements, vision changes, etc...  Reviewed expectations re: course of current medical issues. Questions answered. Outlined signs and symptoms indicating need for more acute intervention. Patient verbalized understanding. After Visit Summary given.   Lestine Box, PA-C 02/14/20 1651

## 2020-02-24 DIAGNOSIS — Z8551 Personal history of malignant neoplasm of bladder: Secondary | ICD-10-CM | POA: Diagnosis not present

## 2020-02-24 DIAGNOSIS — N390 Urinary tract infection, site not specified: Secondary | ICD-10-CM | POA: Diagnosis not present

## 2020-02-24 DIAGNOSIS — B9620 Unspecified Escherichia coli [E. coli] as the cause of diseases classified elsewhere: Secondary | ICD-10-CM | POA: Diagnosis not present

## 2020-02-27 ENCOUNTER — Other Ambulatory Visit (HOSPITAL_COMMUNITY): Payer: Self-pay | Admitting: Urology

## 2020-02-27 MED FILL — NITROFURANTOIN MONO-MCR 100: 100 | 7 days supply | Qty: 14 | Fill #0

## 2020-03-07 NOTE — Progress Notes (Signed)
44 y.o. [id number] Divorced Caucasian female here for annual exam.    Glad to not to have menses anymore since her hysterectomy.  She did not tolerate estrogen due to scalp itching.  She does have hot flashes. She feels depressed.  Not suicidal.   Uses Clonazepam.  Her friends have concern that she may be having cognitive change.  She can feel angry easily and depression and paranoia. Her father has Alzheimer's She would like a referral to neurology for her own evaluation.  Patient was treated for UTI last week with Macrodantin, but she's not sure completely resolved. Still with burning. This is usual for her after her bladder cancer care.  No blood in her urine.  Her last visit with urology was last week.   She drinks a lot of tea.  Did her Covid vaccine in October.  Urine Dip:Neg  PCP:   Elvia Collum, DO  Patient's last menstrual period was 10/30/2018 (approximate).           Sexually active: Yes.    The current method of family planning is status post tubal/hysterectomy.    Exercising: Yes.    walks all day at work Smoker:  Yes, [place] Maintenance: Pap: 02-02-19 Neg:Neg HR HPV, 12/16/17 ASCUS:NEG HR HPV; 08/12/13 Neg:Neg HR HPV History of abnormal Pap:  no MMG: 05-25-19 3D/Neg/density C/BiRads1 Colonoscopy:  NEVER BMD:   n/a  Result  n/a TDaP: within last 10 years Gardasil:   no HIV: Neg in preg Hep C:Unsure Screening Labs:  PCP.    reports that she has been smoking. She has been smoking about 1.00 pack per day. She has never used smokeless tobacco. She reports current alcohol use of about 2.0 standard drinks of alcohol per week. She reports that she does not use drugs.  Past Medical History:  Diagnosis Date  . Anxiety   . Breast nodule 08/12/2013  . COVID-19 09/2018  . Depression   . Dysmenorrhea 08/12/2013  . Fibroid   . History of kidney stones   . Menorrhagia 08/12/2013  . Ovarian cyst    left - 68 x 36 x 22 mm, complex, CA125 40  . UI (urinary  incontinence) 08/12/2013  . Urothelial cancer ([place]) 05/2019    Past Surgical History:  Procedure Laterality Date  . APPENDECTOMY    . BREAST CYST EXCISION    . CESAREAN SECTION    . CYSTOSCOPY N/A 05/30/2019   Procedure: CYSTOSCOPY and Transurethral Resection of a Bladder Tumor;  Surgeon: Nunzio Cobbs, MD;  Location: [facility];  Service: Gynecology;  Laterality: N/A;  . OOPHORECTOMY Right   . TOTAL LAPAROSCOPIC HYSTERECTOMY WITH SALPINGECTOMY N/A 05/30/2019   Procedure: TOTAL LAPAROSCOPIC HYSTERECTOMY WITH LEFT SALPINGECTOMY/POSSIBLE LSO W/COLLECTION OF PELVIS WASHINGS/ks;  Surgeon: Nunzio Cobbs, MD;  Location: [facility];  Service: Gynecology;  Laterality: N/A;  left salpingectomy, possible LSO, collection of pelvic washings/ks  . TUBAL LIGATION      Current Outpatient Medications  Medication Sig Dispense Refill  . clonazePAM (KLONOPIN) 1 MG tablet 1/2-1 po BID prn anxiety (Patient taking differently: Take 0.5-1 mg by mouth 2 (two) times daily as needed for anxiety.) 30 tablet 3  . Meth-Hyo-M Bl-Na Phos-Ph Sal (URO-MP) 118 MG CAPS Take 2 capsules by mouth every 8 (eight) hours as needed.    . nitrofurantoin, macrocrystal-monohydrate, (MACROBID) 100 MG capsule Take 100 mg by mouth 2 (two) times daily.     No current facility-administered medications for  this visit.    Family History  Problem Relation Age of Onset  . Dementia Father   . Bipolar disorder Sister   . Cancer Maternal Grandmother        breast  . Stroke Maternal Grandfather   . Alzheimer's disease Paternal Grandmother   . Heart attack Paternal Grandfather     Review of Systems  Psychiatric/Behavioral:       Depression  All other systems reviewed and are negative.   Exam:   BP 118/70   Pulse 74   Ht 5\' 3"  (1.6 m)   Wt 135 lb (61.2 kg)   LMP 10/30/2018 (Approximate)   SpO2 99%   BMI 23.91 kg/m     General appearance: alert, cooperative and appears  stated age Head: normocephalic, without obvious abnormality, atraumatic Neck: no adenopathy, supple, symmetrical, trachea midline and thyroid normal to inspection and palpation Lungs: clear to auscultation bilaterally Breasts: normal appearance, no masses or tenderness, No nipple retraction or dimpling, No nipple discharge or bleeding, No axillary adenopathy Heart: regular rate and rhythm Abdomen: soft, non-tender; no masses, no organomegaly Extremities: extremities normal, atraumatic, no cyanosis or edema Skin: skin color, texture, turgor normal. No rashes or lesions Lymph nodes: cervical, supraclavicular, and axillary nodes normal. Neurologic: grossly normal  Pelvic: External genitalia:  no lesions              No abnormal inguinal nodes palpated.              Urethra:  normal appearing urethra with no masses, tenderness or lesions              Bartholins and Skenes: normal                 Vagina: normal appearing vagina with normal color and discharge, no lesions              Cervix:absent              Pap taken: No. Bimanual Exam:  Uterus:  absent              Adnexa: no mass, fullness, tenderness              Rectal exam: Yes.  .  Confirms.              Anus:  normal sphincter tone, no lesions  Chaperone was present for exam.  Assessment:   Well woman visit with normal exam. Total laparoscopic hysterectomy with left salpingo-oophorectomy,lysis of adhesions, cystoscopy with TURBT (Transurethral Resection of Bladder Tumor) Bladder cancer. Dysuria.  Negative urine dip.  Hx nonspecific reaction to Zoloft.  Depression.  Hot flashes.  Cognitive change. FH Alzheimer's.  Plan: Mammogram screening discussed. Self breast awareness reviewed. Pap and HR HPV as above. Guidelines for Calcium, Vitamin D, regular exercise program including cardiovascular and weight bearing exercise. Paxil 10 mg.  I reviewed potential side effects.  Referral to neurology.  Fu in 6 weeks.  Follow up  annually and prn.   After visit summary provided.

## 2020-03-08 ENCOUNTER — Ambulatory Visit: Payer: 59 | Admitting: Obstetrics and Gynecology

## 2020-03-08 ENCOUNTER — Other Ambulatory Visit: Payer: Self-pay

## 2020-03-08 ENCOUNTER — Encounter: Payer: Self-pay | Admitting: Obstetrics and Gynecology

## 2020-03-08 VITALS — BP 118/70 | HR 74 | Ht 63.0 in | Wt 135.0 lb

## 2020-03-08 DIAGNOSIS — R4189 Other symptoms and signs involving cognitive functions and awareness: Secondary | ICD-10-CM

## 2020-03-08 DIAGNOSIS — Z01419 Encounter for gynecological examination (general) (routine) without abnormal findings: Secondary | ICD-10-CM

## 2020-03-08 DIAGNOSIS — Z820 Family history of epilepsy and other diseases of the nervous system: Secondary | ICD-10-CM

## 2020-03-08 DIAGNOSIS — R300 Dysuria: Secondary | ICD-10-CM

## 2020-03-08 LAB — POCT URINALYSIS DIPSTICK
Bilirubin, UA: NEGATIVE
Blood, UA: NEGATIVE
Glucose, UA: NEGATIVE
Ketones, UA: NEGATIVE
Leukocytes, UA: NEGATIVE
Nitrite, UA: NEGATIVE
Protein, UA: NEGATIVE
Urobilinogen, UA: 0.2 E.U./dL
pH, UA: 5 (ref 5.0–8.0)

## 2020-03-08 MED ORDER — PAROXETINE HCL 10 MG PO TABS: 10.0000 mg | ORAL_TABLET | ORAL | 1 refills | Status: DC

## 2020-03-08 NOTE — Patient Instructions (Signed)

## 2020-04-26 ENCOUNTER — Other Ambulatory Visit: Payer: Self-pay | Admitting: Obstetrics and Gynecology

## 2020-04-26 ENCOUNTER — Other Ambulatory Visit: Payer: Self-pay

## 2020-04-26 ENCOUNTER — Ambulatory Visit: Payer: 59 | Admitting: Obstetrics and Gynecology

## 2020-04-26 ENCOUNTER — Encounter: Payer: Self-pay | Admitting: Obstetrics and Gynecology

## 2020-04-26 VITALS — BP 120/82 | HR 80 | Ht 63.0 in | Wt 133.0 lb

## 2020-04-26 DIAGNOSIS — F32A Depression, unspecified: Secondary | ICD-10-CM | POA: Diagnosis not present

## 2020-04-26 MED ORDER — PAROXETINE HCL 20 MG PO TABS: 20.0000 mg | ORAL_TABLET | ORAL | 3 refills | Status: DC

## 2020-04-26 MED FILL — PARoxetine HCL 20 MG TABS: 20 | 90 days supply | Qty: 90 | Fill #0

## 2020-04-26 NOTE — Progress Notes (Signed)
GYNECOLOGY  VISIT   HPI: 45 y.o.   Divorced  Caucasian  female   [phone] with Patient's last menstrual period was 10/30/2018 (approximate).   here for  6 week medication follow up.   Started Paxil for depression and hot flashes.  Felt it was working at first, but not now. Still feeling depressed.  Some headaches and dizziness, but nothing major.  Diarrhea, but not daily.  Works third shift, so sleep is not the best.  She will start first shift.   Some anxious feelings.  Has some concerns about paranoia. Denies suicidal ideation.  Concerned she is developing Alzheimer's, which runs in her family. Has an appointment with neurology in March.   Still with hot flashes but they are better.   Did not tolerate Wellbutrin and Zoloft.   GYNECOLOGIC HISTORY: Patient's last menstrual period was 10/30/2018 (approximate). Contraception: tubal/Hyst Menopausal hormone therapy:  none Last mammogram:  05-25-19 3D/Neg/density C/BiRads1 Last pap smear: 02-02-19 Neg:Neg HR HPV, 12/16/17 ASCUS:NEG HR HPV; 08/12/13 Neg:Neg HR HPV        OB History    Gravida  4   Para  3   Term  0   Preterm  0   AB  1   Living  3     SAB  1   IAB  0   Ectopic  0   Multiple  0   Live Births  0              Patient Active Problem List   Diagnosis Date Noted  . Status post laparoscopic hysterectomy 05/30/2019  . Fibroids 02/02/2019  . Hydrosalpinx 02/02/2019  . Bilateral breast cysts 04/08/2018  . Depression, major, single episode, moderate ([place]) 01/23/2018  . Generalized anxiety disorder 01/23/2018  . Adnexal cyst   . Female bladder prolapse 12/19/2017  . Genuine stress incontinence, female 12/19/2017  . Anxiety as acute reaction to exceptional stress 02/05/2015  . Mixed incontinence urge and stress 09/01/2013  . Dyspareunia 09/01/2013  . Menorrhagia 08/12/2013  . Dysmenorrhea 08/12/2013  . Back pain 01/06/2011  . Neck pain 01/06/2011  . NICOTINE ADDICTION 12/28/2008  . FATIGUE  12/28/2008  . UNSPECIFIED URINARY INCONTINENCE 12/28/2008    Past Medical History:  Diagnosis Date  . Anxiety   . Breast nodule 08/12/2013  . COVID-19 09/2018  . Depression   . Dysmenorrhea 08/12/2013  . Fibroid   . History of kidney stones   . Menorrhagia 08/12/2013  . Ovarian cyst    left - 68 x 36 x 22 mm, complex, CA125 40  . UI (urinary incontinence) 08/12/2013  . Urothelial cancer ([place]) 05/2019    Past Surgical History:  Procedure Laterality Date  . APPENDECTOMY    . BREAST CYST EXCISION    . CESAREAN SECTION    . CYSTOSCOPY N/A 05/30/2019   Procedure: CYSTOSCOPY and Transurethral Resection of a Bladder Tumor;  Surgeon: Nunzio Cobbs, MD;  Location: [facility];  Service: Gynecology;  Laterality: N/A;  . OOPHORECTOMY Right   . TOTAL LAPAROSCOPIC HYSTERECTOMY WITH SALPINGECTOMY N/A 05/30/2019   Procedure: TOTAL LAPAROSCOPIC HYSTERECTOMY WITH LEFT SALPINGECTOMY/POSSIBLE LSO W/COLLECTION OF PELVIS WASHINGS/ks;  Surgeon: Nunzio Cobbs, MD;  Location: [facility];  Service: Gynecology;  Laterality: N/A;  left salpingectomy, possible LSO, collection of pelvic washings/ks  . TUBAL LIGATION      Current Outpatient Medications  Medication Sig Dispense Refill  . clonazePAM (KLONOPIN) 1 MG tablet 1/2-1 po BID prn  anxiety (Patient taking differently: Take 0.5-1 mg by mouth 2 (two) times daily as needed for anxiety.) 30 tablet 3  . Meth-Hyo-M Bl-Na Phos-Ph Sal (URO-MP) 118 MG CAPS Take 2 capsules by mouth every 8 (eight) hours as needed.    Marland Kitchen PARoxetine (PAXIL) 10 MG tablet Take 1 tablet (10 mg total) by mouth every morning. 30 tablet 1   No current facility-administered medications for this visit.     ALLERGIES: Zoloft [sertraline hcl]  Family History  Problem Relation Age of Onset  . Dementia Father   . Bipolar disorder Sister   . Cancer Maternal Grandmother        breast  . Stroke Maternal Grandfather   . Alzheimer's disease  Paternal Grandmother   . Heart attack Paternal Grandfather     Social History   Socioeconomic History  . Marital status: Divorced    Spouse name: Not on file  . Number of children: Not on file  . Years of education: Not on file  . Highest education level: Not on file  Occupational History  . Not on file  Tobacco Use  . Smoking status: Current Every Day Smoker    Packs/day: 1.00  . Smokeless tobacco: Never Used  Vaping Use  . Vaping Use: Never used  Substance and Sexual Activity  . Alcohol use: Yes    Alcohol/week: 2.0 standard drinks    Types: 2 Glasses of wine per week    Comment: occasionally  . Drug use: Never  . Sexual activity: Yes    Birth control/protection: None, Surgical    Comment: Tubal/Hyst  Other Topics Concern  . Not on file  Social History Narrative  . Not on file   Social Determinants of Health   Financial Resource Strain: Not on file  Food Insecurity: Not on file  Transportation Needs: Not on file  Physical Activity: Not on file  Stress: Not on file  Social Connections: Not on file  Intimate Partner Violence: Not on file    Review of Systems  All other systems reviewed and are negative.   PHYSICAL EXAMINATION:    BP 120/82   Pulse 80   Ht 5\' 3"  (1.6 m)   Wt 133 lb (60.3 kg)   LMP 10/30/2018 (Approximate)   SpO2 (!) 89%   BMI 23.56 kg/m     General appearance: alert, cooperative and appears stated age  ASSESSMENT  Depression.  Paranoia. FH Alzheimer's.  PLAN  Will Paxil to 20 mg daily.  We discussed referral to psychiatry for other prescription alternatives.  She prefers to see neurology for evaluation and potential treatment with other medication if needed.

## 2020-05-15 ENCOUNTER — Encounter: Payer: Self-pay | Admitting: Neurology

## 2020-05-15 ENCOUNTER — Ambulatory Visit: Payer: 59 | Admitting: Neurology

## 2020-05-15 VITALS — BP 110/72 | HR 88 | Ht 64.0 in | Wt 132.0 lb

## 2020-05-15 DIAGNOSIS — F22 Delusional disorders: Secondary | ICD-10-CM | POA: Diagnosis not present

## 2020-05-15 DIAGNOSIS — R4189 Other symptoms and signs involving cognitive functions and awareness: Secondary | ICD-10-CM

## 2020-05-15 DIAGNOSIS — Z820 Family history of epilepsy and other diseases of the nervous system: Secondary | ICD-10-CM

## 2020-05-15 DIAGNOSIS — R413 Other amnesia: Secondary | ICD-10-CM

## 2020-05-15 NOTE — Patient Instructions (Addendum)
MRI brain Blood work today Let us know how you feel in 3-4 months and If not better we are happy to order formal memory testing   http://APA.org/depression-guideline"> https://clinicalkey.com"> http://point-of-care.elsevierperformancemanager.com/skills/"> http://point-of-care.elsevierperformancemanager.com">  Managing Depression, Adult Depression is a mental health condition that affects your thoughts, feelings, and actions. Being diagnosed with depression can bring you relief if you did not know why you have felt or behaved a certain way. It could also leave you feeling overwhelmed with uncertainty about your future. Preparing yourself to manage your symptoms can help you feel more positive about your future. How to manage lifestyle changes Managing stress Stress is your body's reaction to life changes and events, both good and bad. Stress can add to your feelings of depression. Learning to manage your stress can help lessen your feelings of depression. Try some of the following approaches to reducing your stress (stress reduction techniques):  Listen to music that you enjoy and that inspires you.  Try using a meditation app or take a meditation class.  Develop a practice that helps you connect with your spiritual self. Walk in nature, pray, or go to a place of worship.  Do some deep breathing. To do this, inhale slowly through your nose. Pause at the top of your inhale for a few seconds and then exhale slowly, letting your muscles relax.  Practice yoga to help relax and work your muscles. Choose a stress reduction technique that suits your lifestyle and personality. These techniques take time and practice to develop. Set aside 5-15 minutes a day to do them. Therapists can offer training in these techniques. Other things you can do to manage stress include:  Keeping a stress diary.  Knowing your limits and saying no when you think something is too much.  Paying attention to how you react  to certain situations. You may not be able to control everything, but you can change your reaction.  Adding humor to your life by watching funny films or TV shows.  Making time for activities that you enjoy and that relax you.   Medicines Medicines, such as antidepressants, are often a part of treatment for depression.  Talk with your pharmacist or health care provider about all the medicines, supplements, and herbal products that you take, their possible side effects, and what medicines and other products are safe to take together.  Make sure to report any side effects you may have to your health care provider. Relationships Your health care provider may suggest family therapy, couples therapy, or individual therapy as part of your treatment. How to recognize changes Everyone responds differently to treatment for depression. As you recover from depression, you may start to:  Have more interest in doing activities.  Feel less hopeless.  Have more energy.  Overeat less often, or have a better appetite.  Have better mental focus. It is important to recognize if your depression is not getting better or is getting worse. The symptoms you had in the beginning may return, such as:  Tiredness (fatigue) or low energy.  Eating too much or too little.  Sleeping too much or too little.  Feeling restless, agitated, or hopeless.  Trouble focusing or making decisions.  Unexplained physical complaints.  Feeling irritable, angry, or aggressive. If you or your family members notice these symptoms coming back, let your health care provider know right away. Follow these instructions at home: Activity  Try to get some form of exercise each day, such as walking, biking, swimming, or lifting weights.  Practice  stress reduction techniques.  Engage your mind by taking a class or doing some volunteer work.   Lifestyle  Get the right amount and quality of sleep.  Cut down on using  caffeine, tobacco, alcohol, and other potentially harmful substances.  Eat a healthy diet that includes plenty of vegetables, fruits, whole grains, low-fat dairy products, and lean protein. Do not eat a lot of foods that are high in solid fats, added sugars, or salt (sodium). General instructions  Take over-the-counter and prescription medicines only as told by your health care provider.  Keep all follow-up visits as told by your health care provider. This is important. Where to find support Talking to others Friends and family members can be sources of support and guidance. Talk to trusted friends or family members about your condition. Explain your symptoms to them, and let them know that you are working with a health care provider to treat your depression. Tell friends and family members how they also can be helpful.   Finances  Find appropriate mental health providers that fit with your financial situation.  Talk with your health care provider about options to get reduced prices on your medicines. Where to find more information You can find support in your area from:  Anxiety and Depression Association of America (ADAA): www.adaa.org  Mental Health America: www.mentalhealthamerica.net  [employer] on Mental Illness: www.nami.org Contact a health care provider if:  You stop taking your antidepressant medicines, and you have any of these symptoms: ? Nausea. ? Headache. ? Light-headedness. ? Chills and body aches. ? Not being able to sleep (insomnia).  You or your friends and family think your depression is getting worse. Get help right away if:  You have thoughts of hurting yourself or others. If you ever feel like you may hurt yourself or others, or have thoughts about taking your own life, get help right away. Go to your nearest emergency department or:  Call your local emergency services (911 in the [place]  Call a suicide crisis helpline, such as the [facility] at (415)575-5701. This is open 24 hours a day in the [place]  Text the Crisis Text Line at 984-453-5447 (in the [place].). Summary  If you are diagnosed with depression, preparing yourself to manage your symptoms is a good way to feel positive about your future.  Work with your health care provider on a management plan that includes stress reduction techniques, medicines (if applicable), therapy, and healthy lifestyle habits.  Keep talking with your health care provider about how your treatment is working.  If you have thoughts about taking your own life, call a suicide crisis helpline or text a crisis text line. This information is not intended to replace advice given to you by your health care provider. Make sure you discuss any questions you have with your health care provider. Document Revised: 01/19/2019 Document Reviewed: 01/19/2019 Elsevier Patient Education  2021 [employer].

## 2020-05-15 NOTE — Progress Notes (Signed)
[facility]    Provider:  Dr Jaynee Eagles Requesting Provider: Dr. Gala Romney Primary Care Provider:  Erven Colla, DO  CC:  Cognitive changes  HPI:  Anna Gonzalez is a 45 y.o. female here as requested by Erven Colla, DO for cognitive change.  Past medical history depression, anxiety, UTI.  She has had problems for years, worse the last 2 years, her grandmother was diagnosed with Alzheimer's in her early 19s and die at age 60, her father was diagnosed he is 44 and was showing signs in early 81s, he was diagnosed with alzeimer's about 4 years ago(about 4). She is concerned she has Alzheimer's too. She feels firgetful, she has to write everything down, like this appointment, any meetings for work she has write down, she is forgetting remote and recent memories, she has paranoia all the time, she is paranoid about saying things wrong, worried about upsetting people, last night her hueband got a call at 10pm and she felt paranoid and she has to talk herself down, she has depression 9she is tearing up today), Dr. Quincy Simmonds put her on Paxil, she has tried so many medications and not helping, she has had depression for a while, she is sad a lot of days but it waxes and wanes. She works third shift a lot. She is married, she has 3 children, 3 Kids are in their 9s. She still smokes. She does not drink every day.  She had to go through the courts for her dad. No other focal neurologic deficits, associated symptoms, inciting events or modifiable factors.  Reviewed notes, labs and imaging from outside physicians, which showed:  I reviewed Dr. Tomasita Morrow notes: Patient feels depressed.  Not suicidal.  Her friends have concerns that she may be having cognitive changes.  She can feel angry easily with depression and paranoia.  Her father has Alzheimer's and she is taking care of him.  Review of Systems: Patient complains of symptoms per HPI as well as the following symptoms: Depression,  anxiety, paranoia, social stressors, active smoker. Pertinent negatives and positives per HPI. All others negative.   Social History   Socioeconomic History  . Marital status: Divorced    Spouse name: Not on file  . Number of children: 3  . Years of education: Not on file  . Highest education level: GED or equivalent  Occupational History  . Not on file  Tobacco Use  . Smoking status: Current Every Day Smoker    Packs/day: 1.00  . Smokeless tobacco: Never Used  Vaping Use  . Vaping Use: Never used  Substance and Sexual Activity  . Alcohol use: Yes    Comment: occasionally  . Drug use: Never  . Sexual activity: Yes    Birth control/protection: None, Surgical    Comment: Tubal/Hyst  Other Topics Concern  . Not on file  Social History Narrative   Lives at home with fiance, and 2 of her children   Right handed   Caffeine: all day long    Social Determinants of Health   Financial Resource Strain: Not on file  Food Insecurity: Not on file  Transportation Needs: Not on file  Physical Activity: Not on file  Stress: Not on file  Social Connections: Not on file  Intimate Partner Violence: Not on file    Family History  Problem Relation Age of Onset  . Dementia Father   . Bipolar disorder Sister   . Cancer Maternal Grandmother  breast  . Stroke Maternal Grandfather   . Alzheimer's disease Paternal Grandmother   . Heart attack Paternal Grandfather     Past Medical History:  Diagnosis Date  . Anxiety   . Breast nodule 08/12/2013  . COVID-19 09/2018  . Depression   . Dysmenorrhea 08/12/2013  . Fibroid   . History of kidney stones   . Menorrhagia 08/12/2013  . Ovarian cyst    left - 68 x 36 x 22 mm, complex, CA125 40  . UI (urinary incontinence) 08/12/2013  . Urothelial cancer ([place]) 05/2019    Patient Active Problem List   Diagnosis Date Noted  . Status post laparoscopic hysterectomy 05/30/2019  . Fibroids 02/02/2019  . Hydrosalpinx 02/02/2019  .  Bilateral breast cysts 04/08/2018  . Depression, major, single episode, moderate ([place]) 01/23/2018  . Generalized anxiety disorder 01/23/2018  . Adnexal cyst   . Female bladder prolapse 12/19/2017  . Genuine stress incontinence, female 12/19/2017  . Anxiety as acute reaction to exceptional stress 02/05/2015  . Mixed incontinence urge and stress 09/01/2013  . Dyspareunia 09/01/2013  . Menorrhagia 08/12/2013  . Dysmenorrhea 08/12/2013  . Back pain 01/06/2011  . Neck pain 01/06/2011  . NICOTINE ADDICTION 12/28/2008  . FATIGUE 12/28/2008  . UNSPECIFIED URINARY INCONTINENCE 12/28/2008    Past Surgical History:  Procedure Laterality Date  . APPENDECTOMY    . BREAST CYST EXCISION    . CESAREAN SECTION    . CYSTOSCOPY N/A 05/30/2019   Procedure: CYSTOSCOPY and Transurethral Resection of a Bladder Tumor;  Surgeon: Nunzio Cobbs, MD;  [facility];  Service: Gynecology;  Laterality: N/A;  . OOPHORECTOMY Right   . TOTAL LAPAROSCOPIC HYSTERECTOMY WITH SALPINGECTOMY N/A 05/30/2019   Procedure: TOTAL LAPAROSCOPIC HYSTERECTOMY WITH LEFT SALPINGECTOMY/POSSIBLE LSO W/COLLECTION OF PELVIS WASHINGS/ks;  Surgeon: Nunzio Cobbs, MD;  [facility];  Service: Gynecology;  Laterality: N/A;  left salpingectomy, possible LSO, collection of pelvic washings/ks  . TUBAL LIGATION      Current Outpatient Medications  Medication Sig Dispense Refill  . clonazePAM (KLONOPIN) 1 MG tablet 1/2-1 po BID prn anxiety (Patient taking differently: Take 0.5-1 mg by mouth 2 (two) times daily as needed for anxiety.) 30 tablet 3  . Meth-Hyo-M Bl-Na Phos-Ph Sal (URO-MP) 118 MG CAPS Take 2 capsules by mouth every 8 (eight) hours as needed.    Marland Kitchen PARoxetine (PAXIL) 20 MG tablet Take 1 tablet (20 mg total) by mouth every morning. 90 tablet 3   No current facility-administered medications for this visit.    Allergies as of 05/15/2020 - Review Complete 05/15/2020   Allergen Reaction Noted  . Zoloft [sertraline hcl] Other (See Comments) 03/20/2015    Vitals: BP 110/72 (BP Location: Right Arm, Patient Position: Sitting)   Pulse 88   Ht 5\' 4"  (1.626 m)   Wt 132 lb (59.9 kg)   LMP 10/30/2018 (Approximate)   BMI 22.66 kg/m  Last Weight:  Wt Readings from Last 1 Encounters:  05/15/20 132 lb (59.9 kg)   Last Height:   Ht Readings from Last 1 Encounters:  05/15/20 5\' 4"  (1.626 m)     Physical exam: Exam: Gen: NAD, teary, conversant, well nourised, well groomed                     CV: RRR, no MRG. No Carotid Bruits. No peripheral edema, warm, nontender Eyes: Conjunctivae clear without exudates or hemorrhage  Neuro: Detailed Neurologic Exam  Speech:  Speech is normal; fluent and spontaneous with normal comprehension.  Cognition:    The patient is oriented to person, place, and time;     recent and remote memory intact;     language fluent;     normal attention, concentration,     fund of knowledge Cranial Nerves:    The pupils are equal, round, and reactive to light. The fundi are flat. Visual fields are full to finger confrontation. Extraocular movements are intact. Trigeminal sensation is intact and the muscles of mastication are normal. The face is symmetric. The palate elevates in the midline. Hearing intact. Voice is normal. Shoulder shrug is normal. The tongue has normal motion without fasciculations.   Coordination:    No dysmetria or ataxia  Gait:    Normal native gait  Motor Observation:    No asymmetry, no atrophy, and no involuntary movements noted. Tone:    Normal muscle tone.    Posture:    Posture is normal. normal erect    Strength:    Strength is V/V in the upper and lower limbs.      Sensation: intact to LT     Reflex Exam:  DTR's:    Deep tendon reflexes in the upper and lower extremities are normal bilaterally.   Toes:    The toes are downgoing bilaterally.   Clonus:    Clonus is absent.     Assessment/Plan: This is a lovely patient here for cognitive complaints.  She feels forgetful of daily activities, paranoid(I think more worried a lot than actual paranoia), does not remember remote and recent events, she appears to worry a lot, she has untreated depression and anxiety and is teary today, she also appears to have social stressors in the form of her father with Alzheimer's who she is struggling with, she is a current every day smoker, appears to have several drinks most days (unclear).  -I think patient is likely suffering from depression and anxiety in conjunction with normal cognitive aging and stress.  It is highly unlikely that she would have Alzheimer's at the age of 73 but she is very worried especially given her father's current situation.  I tried to reassure patient but given that her grandmother was diagnosed with Alzheimer's in her early 57s and her father started showing signs in his early 28s, I do not think it is reasonable to complete a work-up with an MRI of the brain and blood work. -We spoke about Alzheimer's, I recommended that she read "the XX brain" which is a great book about empowering women to maximize cognitive health and prevent Alzheimer's disease.   - We also talked about the MIND diet out of [employer].  - We also discussed smoking cessation, alcohol intake limited to maximum 1 drink a day.  - We also discussed stress and I recommended seeing a psychiatrist or psychiatric professional since she appears to have depression that is not entirely treated.  States she has tried multiple medications without complete success.  MRI brain with and without contrast: Family history of early onset Alzheimer's with complaints of subjective cognitive issues we will check an MRI of the brain and perform blood work today.  I encourage patient to reach out to psychiatric services.  If she still feels that she has a cognitive decline, she is welcome to reach out to [place] in 4  to 6 months and we can order formal memory testing.   Orders Placed This Encounter  Procedures  . MR BRAIN  W WO CONTRAST  . B12 and Folate Panel  . Vitamin B1  . Methylmalonic acid, serum  . TSH  . CBC with Differential/Platelets  . Comprehensive metabolic panel   No orders of the defined types were placed in this encounter.   Cc: Erven Colla DO,  Erven Colla, DO, Dr. Gala Romney  Sarina Ill, MD  [facility] [address] [place], [place] [address]  Phone [phone] Fax [phone]  I spent over 60 minutes of face-to-face and non-face-to-face time with patient on the  1. Subjective cognitive impairment   2. Memory loss   3. Cognitive decline   4. FHx: Alzheimer's disease   5. Paranoid behavior ([place])    diagnosis.  This included previsit chart review, lab review, study review, order entry, electronic health record documentation, patient education on the different diagnostic and therapeutic options, counseling and coordination of care, risks and benefits of management, compliance, or risk factor reduction

## 2020-05-17 ENCOUNTER — Telehealth: Payer: Self-pay | Admitting: Neurology

## 2020-05-17 NOTE — Telephone Encounter (Signed)
cone umr auth: NPR Ref # [id number]  lvm for pt to call back to schedule

## 2020-05-24 ENCOUNTER — Ambulatory Visit: Payer: 59 | Admitting: Neurology

## 2020-05-25 LAB — COMPREHENSIVE METABOLIC PANEL
ALT: 16 IU/L (ref 0–32)
AST: 16 IU/L (ref 0–40)
Albumin/Globulin Ratio: 2.1 (ref 1.2–2.2)
Albumin: 4.6 g/dL (ref 3.8–4.8)
Alkaline Phosphatase: 87 IU/L (ref 44–121)
BUN/Creatinine Ratio: 20 (ref 9–23)
BUN: 13 mg/dL (ref 6–24)
Bilirubin Total: 0.4 mg/dL (ref 0.0–1.2)
CO2: 21 mmol/L (ref 20–29)
Calcium: 9.6 mg/dL (ref 8.7–10.2)
Chloride: 103 mmol/L (ref 96–106)
Creatinine, Ser: 0.65 mg/dL (ref 0.57–1.00)
GFR calc Af Amer: 123 mL/min/{1.73_m2} (ref >59)
GFR calc non Af Amer: 107 mL/min/{1.73_m2} (ref >59)
Globulin, Total: 2.2 g/dL (ref 1.5–4.5)
Glucose: 95 mg/dL (ref 65–99)
Potassium: 4 mmol/L (ref 3.5–5.2)
Sodium: 141 mmol/L (ref 134–144)
Total Protein: 6.8 g/dL (ref 6.0–8.5)

## 2020-05-25 LAB — CBC WITH DIFFERENTIAL/PLATELET
Basophils Absolute: 0 10*3/uL (ref 0.0–0.2)
Basos: 1 %
EOS (ABSOLUTE): 0.2 10*3/uL (ref 0.0–0.4)
Eos: 3 %
Hematocrit: 43 % (ref 34.0–46.6)
Hemoglobin: 14.5 g/dL (ref 11.1–15.9)
Immature Grans (Abs): 0 10*3/uL (ref 0.0–0.1)
Immature Granulocytes: 0 %
Lymphocytes Absolute: 2.5 10*3/uL (ref 0.7–3.1)
Lymphs: 41 %
MCH: 30.7 pg (ref 26.6–33.0)
MCHC: 33.7 g/dL (ref 31.5–35.7)
MCV: 91 fL (ref 79–97)
Monocytes Absolute: 0.6 10*3/uL (ref 0.1–0.9)
Monocytes: 9 %
Neutrophils Absolute: 2.9 10*3/uL (ref 1.4–7.0)
Neutrophils: 46 %
Platelets: 261 10*3/uL (ref 150–450)
RBC: 4.72 x10E6/uL (ref 3.77–5.28)
RDW: 12.9 % (ref 11.7–15.4)
WBC: 6.3 10*3/uL (ref 3.4–10.8)

## 2020-05-25 LAB — B12 AND FOLATE PANEL
Folate: 4 ng/mL (ref >3.0)
Vitamin B-12: 653 pg/mL (ref 232–1245)

## 2020-05-25 LAB — TSH: TSH: 0.888 u[IU]/mL (ref 0.450–4.500)

## 2020-05-25 LAB — METHYLMALONIC ACID, SERUM: Methylmalonic Acid: 157 nmol/L (ref 0–378)

## 2020-05-25 LAB — VITAMIN B1: Thiamine: 106.2 nmol/L (ref 66.5–200.0)

## 2020-06-22 ENCOUNTER — Other Ambulatory Visit (HOSPITAL_COMMUNITY): Payer: Self-pay | Admitting: Urology

## 2020-06-22 DIAGNOSIS — N3946 Mixed incontinence: Secondary | ICD-10-CM | POA: Diagnosis not present

## 2020-06-22 DIAGNOSIS — Z8551 Personal history of malignant neoplasm of bladder: Secondary | ICD-10-CM | POA: Diagnosis not present

## 2020-06-22 MED FILL — OXYBUTYNIN CL ER 10 MG TAB: 10 | 30 days supply | Qty: 30 | Fill #0

## 2020-07-03 ENCOUNTER — Other Ambulatory Visit (HOSPITAL_COMMUNITY): Payer: Self-pay

## 2020-08-20 IMAGING — MG DIGITAL SCREENING BILAT W/ TOMO W/ CAD
8 series · 9 of 24 positions shown · non-contrast
Comparison: Previous exam(s).

CLINICAL DATA: Screening.

EXAM:
DIGITAL SCREENING BILATERAL MAMMOGRAM WITH TOMO AND CAD

[R CC synth-2D]
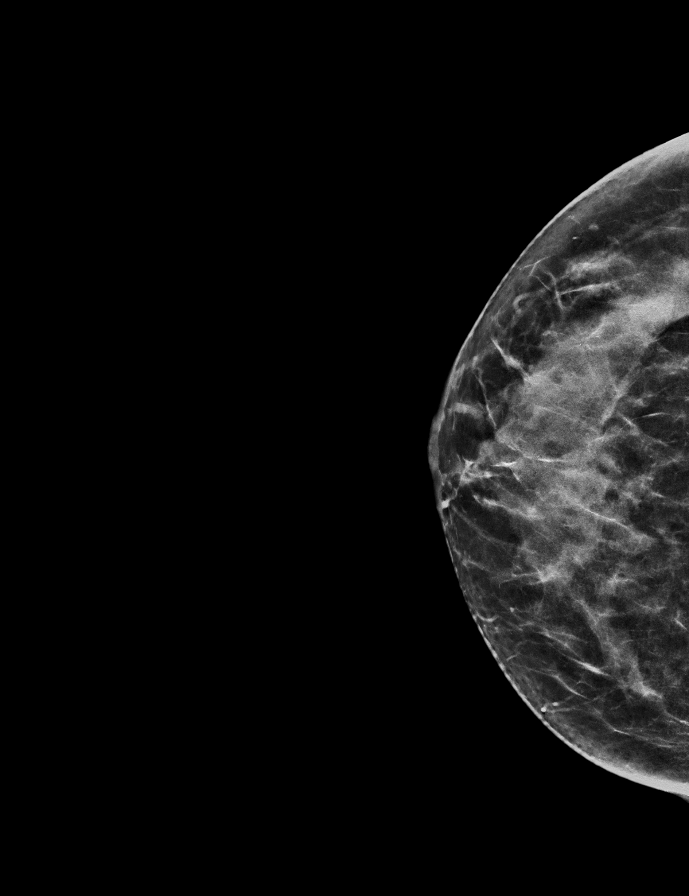

[L MLO synth-2D]
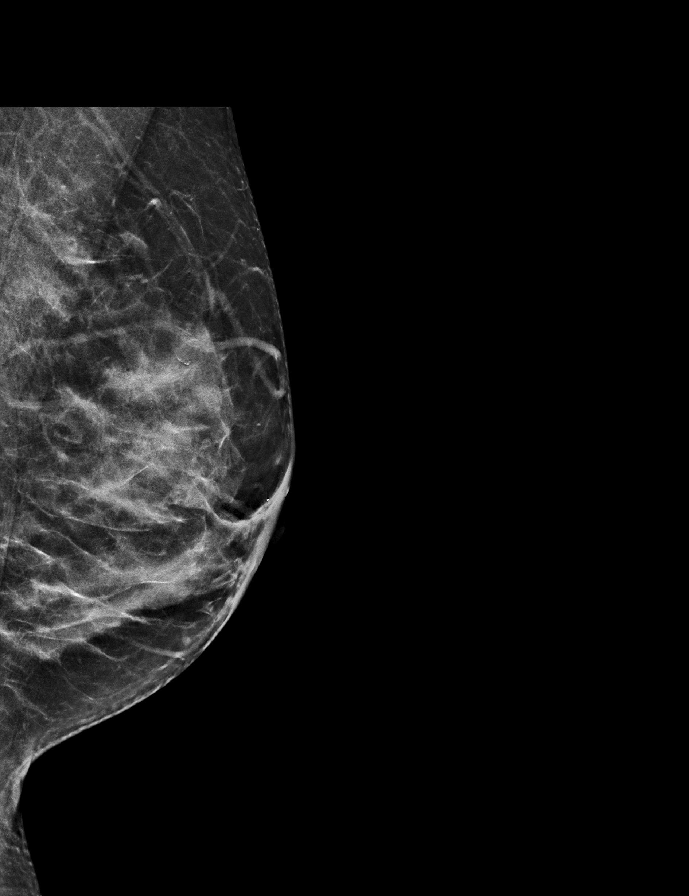

[L CC synth-2D]
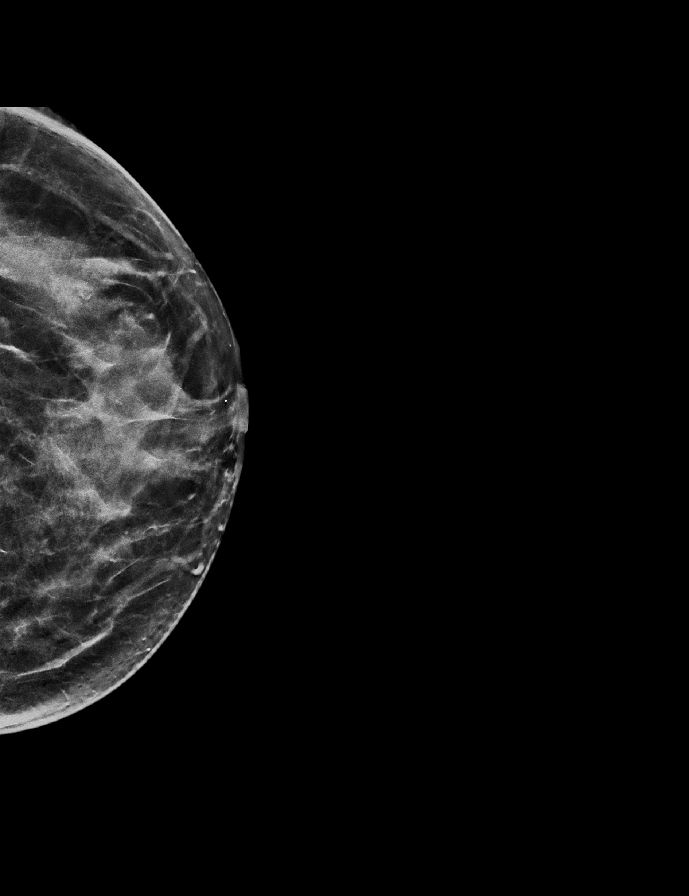

[R MLO synth-2D]
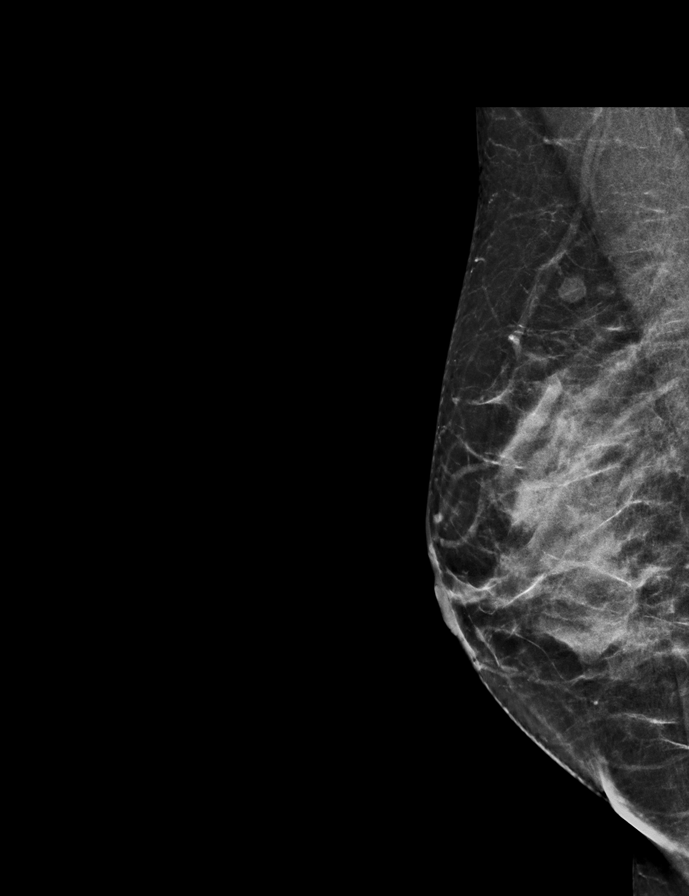

[L CC tomo · 2 of 55 frames shown]
[frame 18/55]
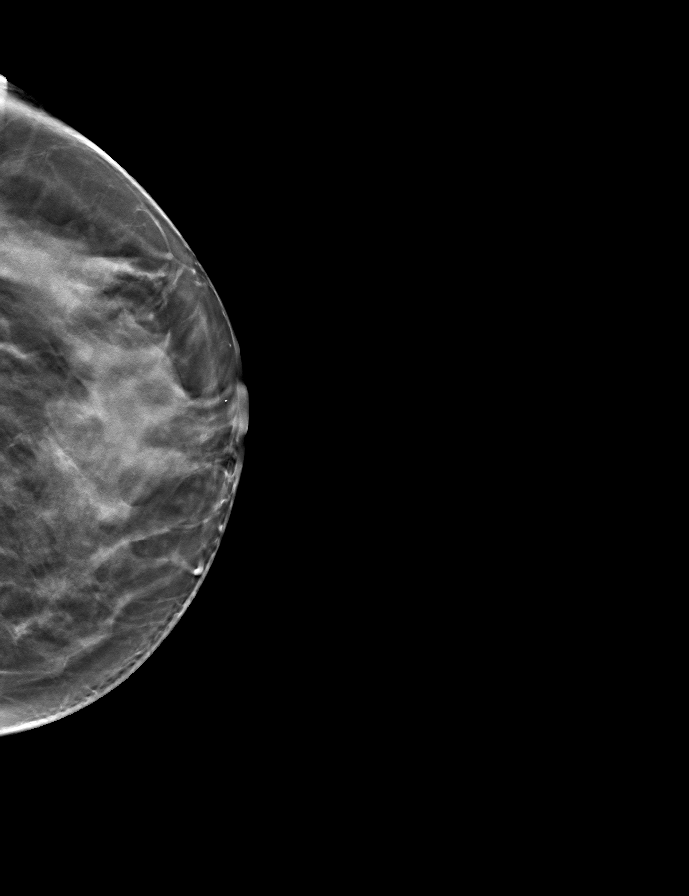
[frame 28/55]
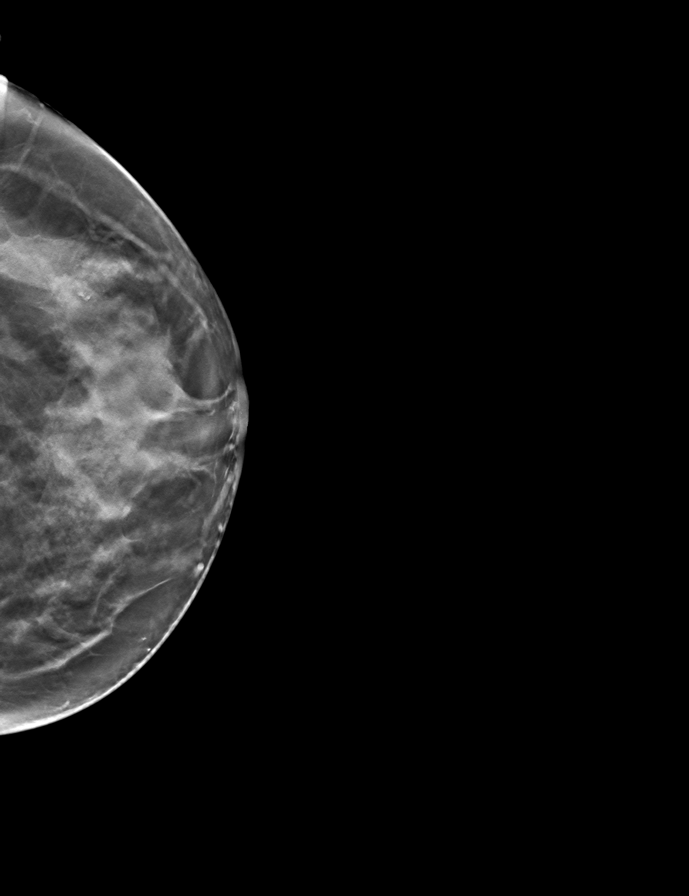

[R MLO tomo · tomo slice 31/60.0]
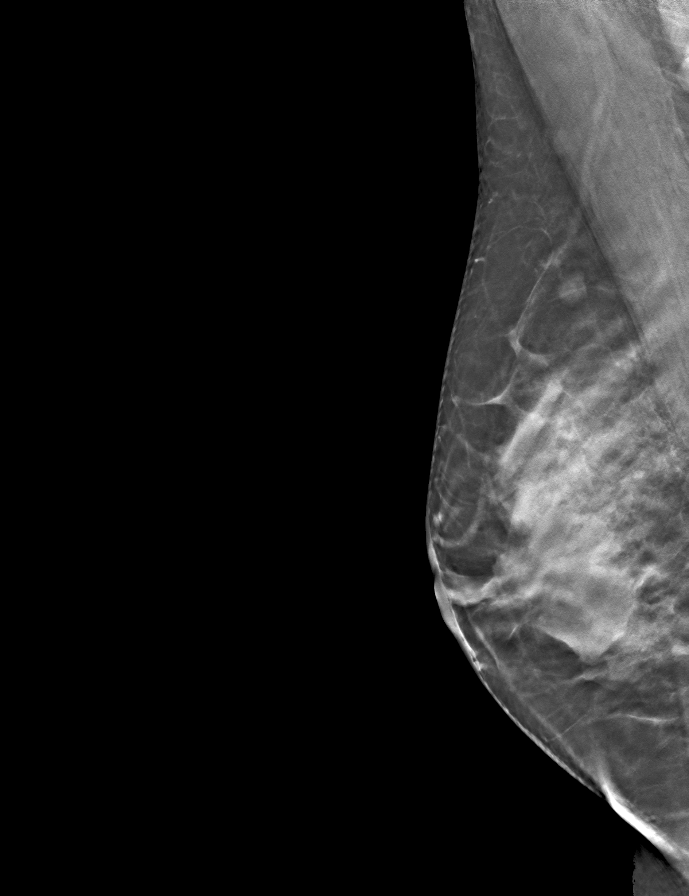

[L MLO tomo · tomo slice 28/55.0]
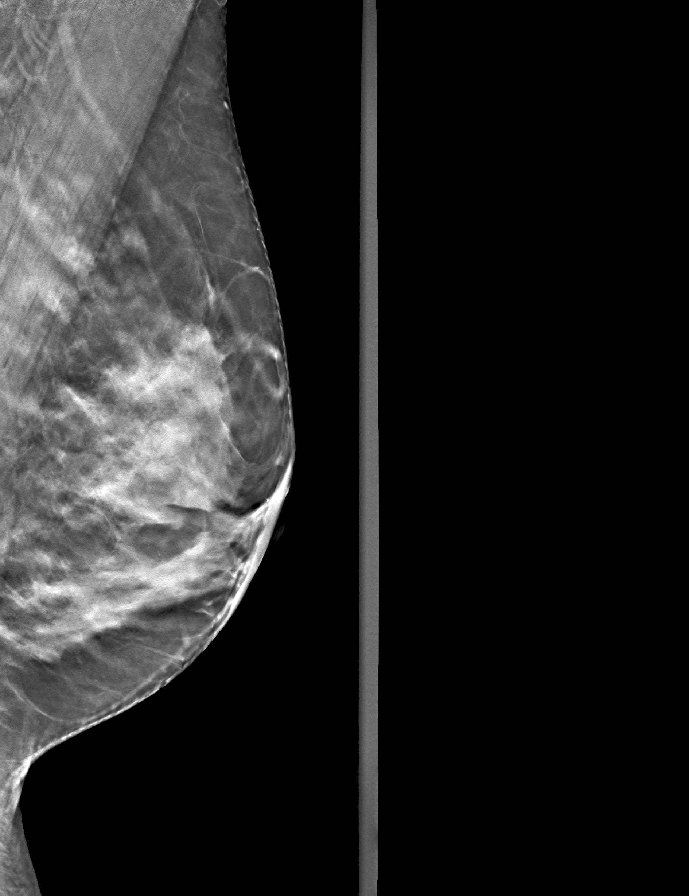

[R CC tomo · tomo slice 27/54.0]
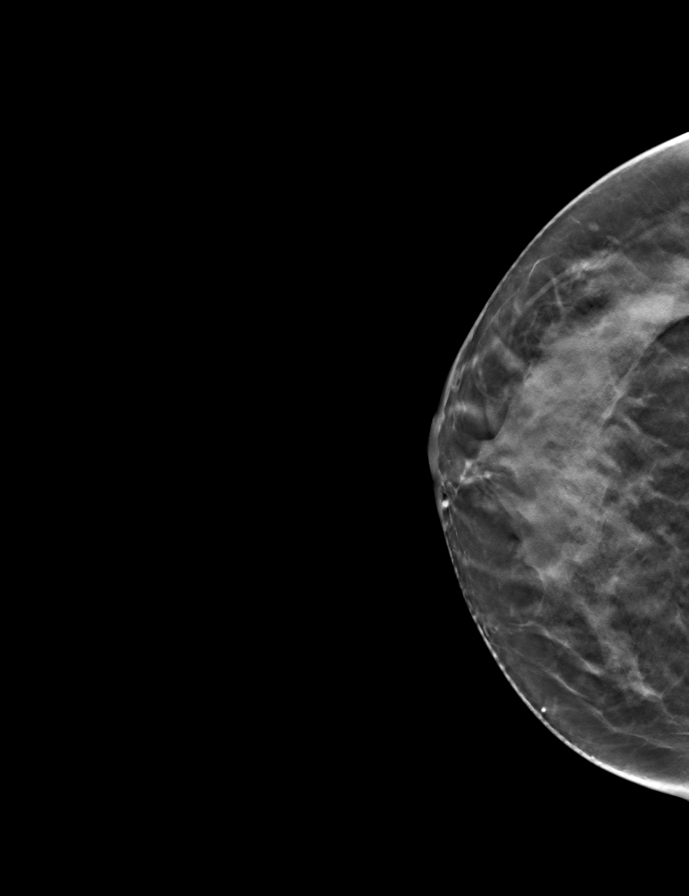

[9 of 24 positions shown; findings below may reference images not displayed]

ACR Breast Density Category c: The breast tissue is heterogeneously
dense, which may obscure small masses.
FINDINGS: There are no findings suspicious for malignancy. Images were
processed with CAD.
IMPRESSION: No mammographic evidence of malignancy. A result letter of this
screening mammogram will be mailed directly to the patient.

RECOMMENDATION:
Screening mammogram in one year. (Code:FT-U-LHB)

BI-RADS CATEGORY  1: Negative.

## 2020-09-19 ENCOUNTER — Other Ambulatory Visit (HOSPITAL_COMMUNITY): Payer: Self-pay

## 2020-09-19 MED FILL — Paroxetine HCl Tab 20 MG: ORAL | 90 days supply | Qty: 90 | Fill #0 | Status: AC

## 2020-10-18 DIAGNOSIS — Z1283 Encounter for screening for malignant neoplasm of skin: Secondary | ICD-10-CM | POA: Diagnosis not present

## 2020-10-18 DIAGNOSIS — D225 Melanocytic nevi of trunk: Secondary | ICD-10-CM | POA: Diagnosis not present

## 2020-11-23 DIAGNOSIS — Z8551 Personal history of malignant neoplasm of bladder: Secondary | ICD-10-CM | POA: Diagnosis not present

## 2020-11-23 DIAGNOSIS — N3946 Mixed incontinence: Secondary | ICD-10-CM | POA: Diagnosis not present

## 2020-11-29 ENCOUNTER — Other Ambulatory Visit (HOSPITAL_COMMUNITY): Payer: Self-pay

## 2020-11-29 MED FILL — Oxybutynin Chloride Tab ER 24HR 10 MG: ORAL | 30 days supply | Qty: 30 | Fill #0 | Status: AC

## 2020-11-30 ENCOUNTER — Other Ambulatory Visit (HOSPITAL_COMMUNITY): Payer: Self-pay

## 2020-12-12 ENCOUNTER — Other Ambulatory Visit (HOSPITAL_COMMUNITY): Payer: Self-pay

## 2020-12-21 ENCOUNTER — Other Ambulatory Visit (HOSPITAL_COMMUNITY): Payer: Self-pay

## 2021-01-23 ENCOUNTER — Other Ambulatory Visit (HOSPITAL_COMMUNITY): Payer: Self-pay

## 2021-01-23 MED FILL — Paroxetine HCl Tab 20 MG: ORAL | 90 days supply | Qty: 90 | Fill #1 | Status: AC

## 2021-03-14 DIAGNOSIS — Z8551 Personal history of malignant neoplasm of bladder: Secondary | ICD-10-CM | POA: Diagnosis not present

## 2021-03-14 DIAGNOSIS — N3946 Mixed incontinence: Secondary | ICD-10-CM | POA: Diagnosis not present

## 2021-04-01 NOTE — Progress Notes (Deleted)
46 y.o. G55P0013 Divorced Caucasian female here for annual exam.    PCP:     Patient's last menstrual period was 10/30/2018 (approximate).           Sexually active: {yes no:314532}  The current method of family planning is tubal ligation/Hyst.    Exercising: {yes no:314532}  {types:19826} Smoker:  {YES NO:22349}  Health Maintenance: Pap:  02-02-19 Neg:Neg HR HPV, 12/16/17 ASCUS:NEG HR HPV; 08/12/13 Neg:Neg HR HPV History of abnormal Pap:  no MMG:  ***05-25-19 3D/Neg/density C/BiRads1 Colonoscopy:  ***NEVER BMD:   n/a  Result  n/a TDaP:  up to date Gardasil:   no HIV:Neg in preg Hep C:Unsure Screening Labs:  Hb today: ***, Urine today: ***   reports that she has been smoking. She has been smoking an average of 1 pack per day. She has never used smokeless tobacco. She reports current alcohol use. She reports that she does not use drugs.  Past Medical History:  Diagnosis Date   Anxiety    Breast nodule 08/12/2013   COVID-19 09/2018   Depression    Dysmenorrhea 08/12/2013   Fibroid    History of kidney stones    Menorrhagia 08/12/2013   Ovarian cyst    left - 68 x 36 x 22 mm, complex, CA125 40   UI (urinary incontinence) 08/12/2013   Urothelial cancer ([place]) 05/2019    Past Surgical History:  Procedure Laterality Date   APPENDECTOMY     BREAST CYST EXCISION     CESAREAN SECTION     CYSTOSCOPY N/A 05/30/2019   Procedure: CYSTOSCOPY and Transurethral Resection of a Bladder Tumor;  Surgeon: Nunzio Cobbs, MD;  [facility];  Service: Gynecology;  Laterality: N/A;   OOPHORECTOMY Right    TOTAL LAPAROSCOPIC HYSTERECTOMY WITH SALPINGECTOMY N/A 05/30/2019   Procedure: TOTAL LAPAROSCOPIC HYSTERECTOMY WITH LEFT SALPINGECTOMY/POSSIBLE LSO W/COLLECTION OF PELVIS WASHINGS/ks;  Surgeon: Nunzio Cobbs, MD;  Location: [facility];  Service: Gynecology;  Laterality: N/A;  left salpingectomy, possible LSO, collection of pelvic  washings/ks   TUBAL LIGATION      Current Outpatient Medications  Medication Sig Dispense Refill   clonazePAM (KLONOPIN) 1 MG tablet 1/2-1 po BID prn anxiety (Patient taking differently: Take 0.5-1 mg by mouth 2 (two) times daily as needed for anxiety.) 30 tablet 3   Meth-Hyo-M Bl-Na Phos-Ph Sal (URO-MP) 118 MG CAPS Take 2 capsules by mouth every 8 (eight) hours as needed.     oxybutynin (DITROPAN-XL) 10 MG 24 hr tablet TAKE 1 TABLET BY MOUTH EVERY DAY 30 tablet 11   PARoxetine (PAXIL) 20 MG tablet TAKE 1 TABLET BY MOUTH ONCE DAILY IN THE MORNING. 90 tablet 3   No current facility-administered medications for this visit.    Family History  Problem Relation Age of Onset   Dementia Father    Bipolar disorder Sister    Cancer Maternal Grandmother        breast   Stroke Maternal Grandfather    Alzheimer's disease Paternal Grandmother    Heart attack Paternal Grandfather     Review of Systems  Exam:   LMP 10/30/2018 (Approximate)     General appearance: alert, cooperative and appears stated age Head: normocephalic, without obvious abnormality, atraumatic Neck: no adenopathy, supple, symmetrical, trachea midline and thyroid normal to inspection and palpation Lungs: clear to auscultation bilaterally Breasts: normal appearance, no masses or tenderness, No nipple retraction or dimpling, No nipple discharge or bleeding, No axillary adenopathy  Heart: regular rate and rhythm Abdomen: soft, non-tender; no masses, no organomegaly Extremities: extremities normal, atraumatic, no cyanosis or edema Skin: skin color, texture, turgor normal. No rashes or lesions Lymph nodes: cervical, supraclavicular, and axillary nodes normal. Neurologic: grossly normal  Pelvic: External genitalia:  no lesions              No abnormal inguinal nodes palpated.              Urethra:  normal appearing urethra with no masses, tenderness or lesions              Bartholins and Skenes: normal                  Vagina: normal appearing vagina with normal color and discharge, no lesions              Cervix: no lesions              Pap taken: {yes no:314532} Bimanual Exam:  Uterus:  normal size, contour, position, consistency, mobility, non-tender              Adnexa: no mass, fullness, tenderness              Rectal exam: {yes no:314532}.  Confirms.              Anus:  normal sphincter tone, no lesions  Chaperone was present for exam:  ***  Assessment:   Well woman visit with gynecologic exam.   Plan: Mammogram screening discussed. Self breast awareness reviewed. Pap and HR HPV as above. Guidelines for Calcium, Vitamin D, regular exercise program including cardiovascular and weight bearing exercise.   Follow up annually and prn.   Additional counseling given.  {yes [id number]. _______ minutes face to face time of which over 50% was spent in counseling.    After visit summary provided.

## 2021-04-02 ENCOUNTER — Ambulatory Visit: Payer: 59 | Admitting: Obstetrics and Gynecology

## 2021-04-04 ENCOUNTER — Ambulatory Visit: Payer: 59 | Admitting: Obstetrics and Gynecology

## 2021-04-24 NOTE — Progress Notes (Signed)
46 y.o. [id number] Divorced Caucasian female here for annual exam.    Has occasional hot flashes, but doing ok.  States Paxil is wonderful.  States her paranoia is resolved.   Seeing urology every 6 month snow.   PCP:   Elvia Collum  Patient's last menstrual period was 10/30/2018 (approximate).           Sexually active: Yes.    The current method of family planning is status post hysterectomy.    Exercising: No.   exercise Smoker:  yes  Health Maintenance: Pap:  02-02-19 neg HPV HR neg, 12-16-17 ascus HPV HR neg, 08-12-13 neg HPV HR neg History of abnormal Pap:  no MMG:  05-25-19 category c density birads 1:neg Colonoscopy:  none BMD:   n/a  Result  n/a TDaP:  within last 10 Gardasil:   no HIV: neg in pregnancy Hep C: unsure if done Screening Labs: Today.  Flu vaccine:  completed.  Covid:  original series done.    reports that she has been smoking cigarettes. She has been smoking an average of 1 pack per day. She has never used smokeless tobacco. She reports current alcohol use. She reports that she does not use drugs.  Past Medical History:  Diagnosis Date   Anxiety    Breast nodule 08/12/2013   COVID-19 09/2018   Depression    Dysmenorrhea 08/12/2013   Fibroid    History of kidney stones    Menorrhagia 08/12/2013   Ovarian cyst    left - 68 x 36 x 22 mm, complex, CA125 40   UI (urinary incontinence) 08/12/2013   Urothelial cancer ([place]) 05/2019    Past Surgical History:  Procedure Laterality Date   APPENDECTOMY     BREAST CYST EXCISION     CESAREAN SECTION     CYSTOSCOPY N/A 05/30/2019   Procedure: CYSTOSCOPY and Transurethral Resection of a Bladder Tumor;  Surgeon: Nunzio Cobbs, MD;  Location: [facility];  Service: Gynecology;  Laterality: N/A;   OOPHORECTOMY Right    TOTAL LAPAROSCOPIC HYSTERECTOMY WITH SALPINGECTOMY N/A 05/30/2019   Procedure: TOTAL LAPAROSCOPIC HYSTERECTOMY WITH LEFT SALPINGECTOMY/POSSIBLE LSO W/COLLECTION OF PELVIS  WASHINGS/ks;  Surgeon: Nunzio Cobbs, MD;  Location: [facility];  Service: Gynecology;  Laterality: N/A;  left salpingectomy, possible LSO, collection of pelvic washings/ks   TUBAL LIGATION      Current Outpatient Medications  Medication Sig Dispense Refill   Meth-Hyo-M Bl-Na Phos-Ph Sal (URO-MP) 118 MG CAPS Take 2 capsules by mouth every 8 (eight) hours as needed.     PARoxetine (PAXIL) 20 MG tablet TAKE 1 TABLET BY MOUTH ONCE DAILY IN THE MORNING. 90 tablet 3   No current facility-administered medications for this visit.    Family History  Problem Relation Age of Onset   Dementia Father    Bipolar disorder Sister    Cancer Maternal Grandmother        breast   Stroke Maternal Grandfather    Alzheimer's disease Paternal Grandmother    Heart attack Paternal Grandfather     Review of Systems  Constitutional: Negative.   HENT: Negative.    Eyes: Negative.   Respiratory: Negative.    Cardiovascular: Negative.   Gastrointestinal: Negative.   Endocrine: Negative.   Genitourinary: Negative.   Musculoskeletal: Negative.   Skin: Negative.   Allergic/Immunologic: Negative.   Neurological: Negative.   Hematological: Negative.   Psychiatric/Behavioral: Negative.     Exam:   BP 118/80  Pulse 72    Resp 16    Ht 5' 2.75" (1.594 m)    Wt 136 lb (61.7 kg)    LMP 10/30/2018 (Approximate)    BMI 24.28 kg/m     General appearance: alert, cooperative and appears stated age Head: normocephalic, without obvious abnormality, atraumatic Neck: no adenopathy, supple, symmetrical, trachea midline and thyroid normal to inspection and palpation Lungs: clear to auscultation bilaterally Breasts: normal appearance, no masses or tenderness, No nipple retraction or dimpling, No nipple discharge or bleeding, No axillary adenopathy Heart: regular rate and rhythm Abdomen: soft, non-tender; no masses, no organomegaly Extremities: extremities normal, atraumatic, no cyanosis or  edema Skin: skin color, texture, turgor normal. No rashes or lesions Lymph nodes: cervical, supraclavicular, and axillary nodes normal. Neurologic: grossly normal  Pelvic: External genitalia:  no lesions              No abnormal inguinal nodes palpated.              Urethra:  normal appearing urethra with no masses, tenderness or lesions              Bartholins and Skenes: normal                 Vagina: normal appearing vagina with normal color and discharge, no lesions              Cervix: absent              Pap taken: no Bimanual Exam:  Uterus:  absent              Adnexa: no mass, fullness, tenderness              Rectal exam: yes.  Confirms.              Anus:  normal sphincter tone, no lesions  Chaperone was present for exam:  Joy, CMA  Assessment:   Well woman visit with gynecologic exam. Total laparoscopic hysterectomy with left salpingo-oophorectomy, lysis of adhesions, cystoscopy with TURBT (Transurethral Resection of Bladder Tumor) Bladder cancer. Menopausal symptoms and anxiety controlled on Paxil. Colon cancer screening.  Elevated cholesterol. FH Alzheimer's.  Plan: Mammogram screening discussed.  She will schedule at [facility]. Self breast awareness reviewed. Pap and HR HPV not indicated.  Guidelines for Calcium, Vitamin D, regular exercise program including cardiovascular and weight bearing exercise. Will check lipids, CMP and CBC today.  Referral for colonoscopy.  Follow up annually and prn.   After visit summary provided.

## 2021-04-26 ENCOUNTER — Encounter: Payer: Self-pay | Admitting: Obstetrics and Gynecology

## 2021-04-26 ENCOUNTER — Ambulatory Visit (INDEPENDENT_AMBULATORY_CARE_PROVIDER_SITE_OTHER): Payer: 59 | Admitting: Obstetrics and Gynecology

## 2021-04-26 ENCOUNTER — Other Ambulatory Visit (HOSPITAL_COMMUNITY): Payer: Self-pay

## 2021-04-26 ENCOUNTER — Other Ambulatory Visit: Payer: Self-pay

## 2021-04-26 VITALS — BP 118/80 | HR 72 | Resp 16 | Ht 62.75 in | Wt 136.0 lb

## 2021-04-26 DIAGNOSIS — Z01419 Encounter for gynecological examination (general) (routine) without abnormal findings: Secondary | ICD-10-CM | POA: Diagnosis not present

## 2021-04-26 DIAGNOSIS — E7800 Pure hypercholesterolemia, unspecified: Secondary | ICD-10-CM

## 2021-04-26 DIAGNOSIS — Z1211 Encounter for screening for malignant neoplasm of colon: Secondary | ICD-10-CM | POA: Diagnosis not present

## 2021-04-26 MED ORDER — PAROXETINE HCL 20 MG PO TABS
20.0000 mg | ORAL_TABLET | Freq: Every morning | ORAL | 3 refills | Status: DC
  Filled 2021-04-26: qty 90, 90d supply, fill #0
  Filled 2021-05-21 – 2021-09-16 (×2): qty 90, 90d supply, fill #1
  Filled 2021-12-10: qty 90, 90d supply, fill #2
  Filled 2022-03-10 – 2022-04-10 (×2): qty 90, 90d supply, fill #3

## 2021-04-26 NOTE — Patient Instructions (Signed)

## 2021-04-27 LAB — CBC
HCT: 41.5 % (ref 35.0–45.0)
Hemoglobin: 14 g/dL (ref 11.7–15.5)
MCH: 31.1 pg (ref 27.0–33.0)
MCHC: 33.7 g/dL (ref 32.0–36.0)
MCV: 92.2 fL (ref 80.0–100.0)
MPV: 11.4 fL (ref 7.5–12.5)
Platelets: 253 10*3/uL (ref 140–400)
RBC: 4.5 10*6/uL (ref 3.80–5.10)
RDW: 12.2 % (ref 11.0–15.0)
WBC: 6.8 10*3/uL (ref 3.8–10.8)

## 2021-04-27 LAB — COMPREHENSIVE METABOLIC PANEL
AG Ratio: 2.1 (calc) (ref 1.0–2.5)
ALT: 13 U/L (ref 6–29)
AST: 14 U/L (ref 10–35)
Albumin: 4.7 g/dL (ref 3.6–5.1)
Alkaline phosphatase (APISO): 66 U/L (ref 31–125)
BUN: 12 mg/dL (ref 7–25)
CO2: 26 mmol/L (ref 20–32)
Calcium: 9.9 mg/dL (ref 8.6–10.2)
Chloride: 105 mmol/L (ref 98–110)
Creat: 0.69 mg/dL (ref 0.50–0.99)
Globulin: 2.2 g/dL (calc) (ref 1.9–3.7)
Glucose, Bld: 91 mg/dL (ref 65–99)
Potassium: 3.9 mmol/L (ref 3.5–5.3)
Sodium: 141 mmol/L (ref 135–146)
Total Bilirubin: 0.3 mg/dL (ref 0.2–1.2)
Total Protein: 6.9 g/dL (ref 6.1–8.1)

## 2021-04-27 LAB — LIPID PANEL
Cholesterol: 229 mg/dL — ABNORMAL HIGH (ref <200)
HDL: 56 mg/dL (ref >=50)
LDL Cholesterol (Calc): 142 mg/dL (calc) — ABNORMAL HIGH
Non-HDL Cholesterol (Calc): 173 mg/dL (calc) — ABNORMAL HIGH (ref <130)
Total CHOL/HDL Ratio: 4.1 (calc) (ref <5.0)
Triglycerides: 170 mg/dL — ABNORMAL HIGH (ref <150)

## 2021-05-21 ENCOUNTER — Other Ambulatory Visit (HOSPITAL_COMMUNITY): Payer: Self-pay

## 2021-06-05 ENCOUNTER — Other Ambulatory Visit (HOSPITAL_COMMUNITY): Payer: Self-pay

## 2021-09-16 ENCOUNTER — Other Ambulatory Visit (HOSPITAL_COMMUNITY): Payer: Self-pay

## 2021-12-10 ENCOUNTER — Other Ambulatory Visit (HOSPITAL_COMMUNITY): Payer: Self-pay

## 2022-01-06 DIAGNOSIS — C670 Malignant neoplasm of trigone of bladder: Secondary | ICD-10-CM | POA: Diagnosis not present

## 2022-01-06 DIAGNOSIS — Z8551 Personal history of malignant neoplasm of bladder: Secondary | ICD-10-CM | POA: Diagnosis not present

## 2022-03-10 ENCOUNTER — Other Ambulatory Visit (HOSPITAL_COMMUNITY): Payer: Self-pay

## 2022-03-10 ENCOUNTER — Other Ambulatory Visit: Payer: Self-pay

## 2022-03-28 ENCOUNTER — Other Ambulatory Visit: Payer: Self-pay

## 2022-04-10 ENCOUNTER — Other Ambulatory Visit (HOSPITAL_COMMUNITY): Payer: Self-pay

## 2022-04-11 ENCOUNTER — Other Ambulatory Visit: Payer: Self-pay

## 2022-04-21 ENCOUNTER — Other Ambulatory Visit (HOSPITAL_COMMUNITY): Payer: Self-pay

## 2022-04-28 ENCOUNTER — Encounter: Payer: Self-pay | Admitting: Family Medicine

## 2022-04-28 ENCOUNTER — Ambulatory Visit: Payer: Commercial Managed Care - PPO | Admitting: Family Medicine

## 2022-04-28 VITALS — BP 123/78 | HR 83 | Ht 63.0 in | Wt 140.0 lb

## 2022-04-28 DIAGNOSIS — R109 Unspecified abdominal pain: Secondary | ICD-10-CM

## 2022-04-28 DIAGNOSIS — R1011 Right upper quadrant pain: Secondary | ICD-10-CM

## 2022-04-28 DIAGNOSIS — Z1239 Encounter for other screening for malignant neoplasm of breast: Secondary | ICD-10-CM

## 2022-04-28 NOTE — Patient Instructions (Addendum)
It was a pleasure meeting with you today. Please Follow up with Ultrasound If symptoms worsen please contact your Health care provider and visit ED

## 2022-04-28 NOTE — Progress Notes (Unsigned)
   New Patient Office Visit   Subjective   Patient ID: Anna Gonzalez, female    DOB: 04/28/1975  Age: 47 y.o. MRN: [id number]  CC:  Chief Complaint  Patient presents with   Establish Care   Flank Pain    Patient complains of R flank pain for about 2 months.     HPI Anna Gonzalez presents to establish care. She  has a past medical history of Anxiety, Breast nodule (08/12/2013), COVID-19 (09/2018), Depression, Dysmenorrhea (08/12/2013), Fibroid, History of kidney stones, Menorrhagia (08/12/2013), Ovarian cyst, UI (urinary incontinence) (08/12/2013), and Urothelial cancer ([place]) (05/2019).  Right abdominal dull pain  Makes in worse by   Onset: Location: Pain scale:  Duration: Characteristics: Associated symptoms/aggravating factors: Relieving factors/ Radiation: Time:   Flank Pain This is a new problem. The current episode started more than 1 month ago. The problem occurs intermittently. The problem has been rapidly improving since onset. The pain is at a severity of 5/10. The pain is mild. The pain is Worse during the day. Associated symptoms include abdominal pain. Pertinent negatives include no fever or numbness. She has tried nothing for the symptoms.    ***  Outpatient Encounter Medications as of 04/28/2022  Medication Sig   PARoxetine (PAXIL) 20 MG tablet Take 1 tablet (20 mg total) by mouth every morning.   Meth-Hyo-M Bl-Na Phos-Ph Sal (URO-MP) 118 MG CAPS Take 2 capsules by mouth every 8 (eight) hours as needed.   No facility-administered encounter medications on file as of 04/28/2022.    Past Surgical History:  Procedure Laterality Date   APPENDECTOMY     BREAST CYST EXCISION     CESAREAN SECTION     CYSTOSCOPY N/A 05/30/2019   Procedure: CYSTOSCOPY and Transurethral Resection of a Bladder Tumor;  Surgeon: Anna Cobbs, MD;  Location: [facility];  Service: Gynecology;  Laterality: N/A;   OOPHORECTOMY Right    TOTAL LAPAROSCOPIC  HYSTERECTOMY WITH SALPINGECTOMY N/A 05/30/2019   Procedure: TOTAL LAPAROSCOPIC HYSTERECTOMY WITH LEFT SALPINGECTOMY/POSSIBLE LSO W/COLLECTION OF PELVIS WASHINGS/ks;  Surgeon: Anna Cobbs, MD;  Location: [facility];  Service: Gynecology;  Laterality: N/A;  left salpingectomy, possible LSO, collection of pelvic washings/ks   TUBAL LIGATION      Review of Systems  Constitutional:  Negative for chills and fever.  Gastrointestinal:  Positive for abdominal pain and diarrhea. Negative for nausea.  Genitourinary:  Negative for flank pain.  Neurological:  Negative for numbness.      Objective    BP 123/78   Pulse 83   Ht '5\' 3"'$  (1.6 m)   Wt 140 lb (63.5 kg)   LMP 10/30/2018 (Approximate)   SpO2 98%   BMI 24.80 kg/m   Physical Exam    Assessment & Plan:  There are no diagnoses linked to this encounter.  No follow-ups on file.   Renard Hamper Ria Comment, FNP

## 2022-04-30 DIAGNOSIS — R1011 Right upper quadrant pain: Secondary | ICD-10-CM | POA: Insufficient documentation

## 2022-04-30 NOTE — Assessment & Plan Note (Addendum)
Mild discomfort felt RUQ on light palpitation Ordered placed for US abdomen RUQ for further evaluation. Explained to patient avoid fatty foods,  intake of bland diet as tolerated. Follow-up in unable to keep food/fluid down x 24 hours, dizziness, fevers, worsening orpersistent symptoms. Patient verbalizes understanding regarding plan of care and all questions answered.

## 2022-05-05 ENCOUNTER — Ambulatory Visit (HOSPITAL_COMMUNITY): Payer: Commercial Managed Care - PPO

## 2022-05-14 ENCOUNTER — Ambulatory Visit (HOSPITAL_COMMUNITY)
Admission: RE | Admit: 2022-05-14 | Discharge: 2022-05-14 | Disposition: A | Discharge status: Home / Self Care | Payer: Commercial Managed Care - PPO | Source: Ambulatory Visit | Attending: Family Medicine | Admitting: Family Medicine

## 2022-05-14 DIAGNOSIS — R1011 Right upper quadrant pain: Secondary | ICD-10-CM | POA: Insufficient documentation

## 2022-05-30 NOTE — Telephone Encounter (Signed)
error 

## 2022-06-04 ENCOUNTER — Other Ambulatory Visit: Payer: Self-pay | Admitting: Family Medicine

## 2022-06-05 NOTE — Progress Notes (Signed)
Elna, Ultrasound was negative for gallstones You have a fat containing hernia near the liver  measuring 1.6 cm does not require surgery removable. I advise lifestyle modifications follow diet low in saturated fat, reduce dietary salt intake, avoid fatty foods, maintain an exercise routine 3 to 5 days a week for a minimum total of 150 minutes.  Follow up with me on 06/30/22 for your annual physical and routine labs

## 2022-06-30 ENCOUNTER — Encounter: Payer: Self-pay | Admitting: Family Medicine

## 2022-06-30 ENCOUNTER — Ambulatory Visit (INDEPENDENT_AMBULATORY_CARE_PROVIDER_SITE_OTHER): Payer: Commercial Managed Care - PPO | Admitting: Family Medicine

## 2022-06-30 ENCOUNTER — Other Ambulatory Visit (HOSPITAL_COMMUNITY): Payer: Self-pay

## 2022-06-30 VITALS — BP 124/80 | HR 76 | Ht 63.0 in | Wt 139.0 lb

## 2022-06-30 DIAGNOSIS — Z114 Encounter for screening for human immunodeficiency virus [HIV]: Secondary | ICD-10-CM

## 2022-06-30 DIAGNOSIS — Z0001 Encounter for general adult medical examination with abnormal findings: Secondary | ICD-10-CM

## 2022-06-30 DIAGNOSIS — Z1329 Encounter for screening for other suspected endocrine disorder: Secondary | ICD-10-CM

## 2022-06-30 DIAGNOSIS — Z13220 Encounter for screening for lipoid disorders: Secondary | ICD-10-CM

## 2022-06-30 DIAGNOSIS — I10 Essential (primary) hypertension: Secondary | ICD-10-CM

## 2022-06-30 DIAGNOSIS — Z1321 Encounter for screening for nutritional disorder: Secondary | ICD-10-CM

## 2022-06-30 DIAGNOSIS — Z131 Encounter for screening for diabetes mellitus: Secondary | ICD-10-CM

## 2022-06-30 DIAGNOSIS — Z1211 Encounter for screening for malignant neoplasm of colon: Secondary | ICD-10-CM

## 2022-06-30 DIAGNOSIS — Z1159 Encounter for screening for other viral diseases: Secondary | ICD-10-CM

## 2022-06-30 MED ORDER — PAROXETINE HCL 20 MG PO TABS
20.0000 mg | ORAL_TABLET | Freq: Every morning | ORAL | 3 refills | Status: DC
  Filled 2022-06-30 – 2022-07-11 (×2): qty 90, 90d supply, fill #0
  Filled 2023-01-28 – 2023-02-02 (×2): qty 90, 90d supply, fill #1

## 2022-06-30 NOTE — Progress Notes (Signed)
Complete physical exam  Patient: Anna Gonzalez   DOB: 1975-05-26   47 y.o. Female  MRN: [id number]  Subjective:    Chief Complaint  Patient presents with   Annual Exam    Anna Gonzalez is a 47 y.o. female who presents today for a complete physical exam. She reports consuming a general diet. The patient does not participate in regular exercise at present. She generally feels well. She reports sleeping well. She does not have additional problems to discuss today.    Most recent fall risk assessment:    06/30/2022    1:53 PM  Fall Risk   Falls in the past year? 0  Number falls in past yr: 0  Injury with Fall? 0  Risk for fall due to : No Fall Risks  Follow up Falls evaluation completed     Most recent depression screenings:    06/30/2022    1:53 PM 04/28/2022    3:09 PM  PHQ 2/9 Scores  PHQ - 2 Score 0 0  PHQ- 9 Score 0 0    Vision:Not within last year  and Dental: No current dental problems  Patient Care Team: Del Newman Nip, Tenna Child, FNP as PCP - General (Family Medicine) Ardell Isaacs, Forrestine Him, MD as Consulting Physician (Obstetrics and Gynecology)   Outpatient Medications Prior to Visit  Medication Sig   Meth-Hyo-M Bl-Na Phos-Ph Sal (URO-MP) 118 MG CAPS Take 2 capsules by mouth every 8 (eight) hours as needed.   PARoxetine (PAXIL) 20 MG tablet Take 1 tablet (20 mg total) by mouth every morning.   No facility-administered medications prior to visit.    Review of Systems  Constitutional:  Negative for chills and fever.  HENT:  Negative for ear pain.   Eyes:  Negative for blurred vision.  Respiratory:  Negative for cough.   Cardiovascular:  Negative for chest pain.  Genitourinary:  Negative for flank pain and hematuria.  Neurological:  Negative for dizziness and headaches.       Objective:    BP 124/80   Pulse 76   Ht 5\' 3"  (1.6 m)   Wt 139 lb (63 kg)   LMP 10/30/2018 (Approximate)   SpO2 99%   BMI 24.62 kg/m  BP Readings from Last 3  Encounters:  06/30/22 124/80  04/28/22 123/78  04/26/21 118/80      Physical Exam Vitals reviewed.  Constitutional:      General: She is not in acute distress.    Appearance: Normal appearance. She is not ill-appearing, toxic-appearing or diaphoretic.  HENT:     Right Ear: Tympanic membrane normal. There is no impacted cerumen.     Left Ear: Tympanic membrane normal. There is no impacted cerumen.     Nose: Nose normal. No congestion.     Mouth/Throat:     Mouth: Mucous membranes are moist.     Pharynx: No posterior oropharyngeal erythema.  Eyes:     General:        Right eye: No discharge.        Left eye: No discharge.     Conjunctiva/sclera: Conjunctivae normal.  Cardiovascular:     Rate and Rhythm: Normal rate and regular rhythm.     Pulses: Normal pulses.     Heart sounds: Normal heart sounds.  Pulmonary:     Effort: Pulmonary effort is normal. No respiratory distress.     Breath sounds: Normal breath sounds.  Abdominal:     General: Bowel sounds are normal.  There is no distension.     Palpations: Abdomen is soft. There is no mass.     Tenderness: There is no right CVA tenderness or left CVA tenderness.  Musculoskeletal:        General: Normal range of motion.     Cervical back: Normal range of motion.     Right lower leg: No edema.     Left lower leg: No edema.  Skin:    General: Skin is warm and dry.     Capillary Refill: Capillary refill takes less than 2 seconds.  Neurological:     General: No focal deficit present.     Mental Status: She is alert and oriented to person, place, and time. Mental status is at baseline.     Coordination: Coordination normal.     Gait: Gait normal.  Psychiatric:        Mood and Affect: Mood normal.        Behavior: Behavior normal.        Thought Content: Thought content normal.        Judgment: Judgment normal.      No results found for any visits on 06/30/22.    Assessment & Plan:    Routine Health Maintenance and  Physical Exam  Immunization History  Administered Date(s) Administered   Influenza Inj Mdck Quad Pf 12/02/2021   Influenza-Unspecified 01/02/2015   PFIZER(Purple Top)SARS-COV-2 Vaccination 01/20/2020, 02/10/2020   Tdap 05/05/2018    Health Maintenance  Topic Date Due   HIV Screening  Never done   Hepatitis C Screening  Never done   COLONOSCOPY (Pts 45-77yrs Insurance coverage will need to be confirmed)  Never done   COVID-19 Vaccine (3 - Pfizer risk series) 03/09/2020   INFLUENZA VACCINE  10/23/2022   PAP SMEAR-Modifier  05/08/2024   DTaP/Tdap/Td (2 - Td or Tdap) 05/05/2028   HPV VACCINES  Aged Out    Discussed health benefits of physical activity, and encouraged her to engage in regular exercise appropriate for her age and condition.  Primary hypertension -     Microalbumin / creatinine urine ratio -     CMP14+EGFR -     CBC with Differential/Platelet  Screening for thyroid disorder -     TSH + free T4  Need for hepatitis C screening test -     Hepatitis C antibody  Encounter for vitamin deficiency screening -     VITAMIN D 25 Hydroxy (Vit-D Deficiency, Fractures)  Screening for HIV (human immunodeficiency virus) -     HIV Antibody (routine testing w rflx)  Screening for lipid disorders -     Lipid panel  Screening for diabetes mellitus -     Hemoglobin A1c  Encounter for screening colonoscopy -     Cologuard    No follow-ups on file.     Cruzita Lederer Newman Nip, FNP

## 2022-06-30 NOTE — Patient Instructions (Signed)
It was pleasure meeting with you today. Follow up with your primary health provider if any health concerns arises. If symptoms worsen please contact your primary care provider and/or visit the emergency department.  

## 2022-07-10 DIAGNOSIS — Z8551 Personal history of malignant neoplasm of bladder: Secondary | ICD-10-CM | POA: Diagnosis not present

## 2022-07-11 ENCOUNTER — Other Ambulatory Visit (HOSPITAL_COMMUNITY): Payer: Self-pay

## 2022-07-11 ENCOUNTER — Other Ambulatory Visit: Payer: Self-pay

## 2022-10-20 ENCOUNTER — Other Ambulatory Visit (HOSPITAL_COMMUNITY): Payer: Self-pay

## 2023-01-28 ENCOUNTER — Other Ambulatory Visit (HOSPITAL_COMMUNITY): Payer: Self-pay

## 2023-02-02 ENCOUNTER — Other Ambulatory Visit (HOSPITAL_COMMUNITY): Payer: Self-pay

## 2023-04-30 ENCOUNTER — Other Ambulatory Visit (HOSPITAL_COMMUNITY): Payer: Self-pay

## 2023-07-27 ENCOUNTER — Other Ambulatory Visit: Payer: Self-pay | Admitting: Family Medicine

## 2023-08-03 ENCOUNTER — Other Ambulatory Visit (HOSPITAL_COMMUNITY): Payer: Self-pay

## 2023-08-06 DIAGNOSIS — R8289 Other abnormal findings on cytological and histological examination of urine: Secondary | ICD-10-CM | POA: Diagnosis not present

## 2023-08-06 DIAGNOSIS — Z8551 Personal history of malignant neoplasm of bladder: Secondary | ICD-10-CM | POA: Diagnosis not present

## 2023-10-06 ENCOUNTER — Other Ambulatory Visit: Payer: Self-pay | Admitting: Family Medicine

## 2023-10-09 ENCOUNTER — Other Ambulatory Visit (HOSPITAL_COMMUNITY): Payer: Self-pay

## 2023-11-24 ENCOUNTER — Telehealth: Admitting: Physician Assistant

## 2023-11-24 DIAGNOSIS — J40 Bronchitis, not specified as acute or chronic: Secondary | ICD-10-CM

## 2023-11-24 DIAGNOSIS — F17200 Nicotine dependence, unspecified, uncomplicated: Secondary | ICD-10-CM

## 2023-11-24 MED ORDER — BENZONATATE 100 MG PO CAPS: 100.0000 mg | ORAL_CAPSULE | Freq: Three times a day (TID) | ORAL | 0 refills | Status: AC | PRN

## 2023-11-24 MED ORDER — AZITHROMYCIN 250 MG PO TABS: ORAL_TABLET | ORAL | 0 refills | Status: AC

## 2023-11-24 NOTE — Progress Notes (Signed)
 Virtual Visit Consent   Anna Gonzalez, you are scheduled for a virtual visit with a [facility] provider today. Just as with appointments in the office, your consent must be obtained to participate. Your consent will be active for this visit and any virtual visit you may have with one of our providers in the next 365 days. If you have a MyChart account, a copy of this consent can be sent to you electronically.  As this is a virtual visit, video technology does not allow for your provider to perform a traditional examination. This may limit your provider's ability to fully assess your condition. If your provider identifies any concerns that need to be evaluated in person or the need to arrange testing (such as labs, EKG, etc.), we will make arrangements to do so. Although advances in technology are sophisticated, we cannot ensure that it will always work on either your end or our end. If the connection with a video visit is poor, the visit may have to be switched to a telephone visit. With either a video or telephone visit, we are not always able to ensure that we have a secure connection.  By engaging in this virtual visit, you consent to the provision of healthcare and authorize for your insurance to be billed (if applicable) for the services provided during this visit. Depending on your insurance coverage, you may receive a charge related to this service.  I need to obtain your verbal consent now. Are you willing to proceed with your visit today? Anna Gonzalez has provided verbal consent on 11/24/2023 for a virtual visit (video or telephone). [name], PA-C  Date: 11/24/2023 11:44 AM   Virtual Visit via Video Note   I, Anna Gonzalez, connected with  Anna Gonzalez  ([id number], [birth year]) on 11/24/23 at 11:30 AM EDT by a video-enabled telemedicine application and verified that I am speaking with the correct person using two identifiers.  Location: Patient: Virtual Visit Location Patient:  Other: work Provider: Pharmacist, community: Home Office   I discussed the limitations of evaluation and management by telemedicine and the availability of in person appointments. The patient expressed understanding and agreed to proceed.    History of Present Illness: Anna Gonzalez is a 48 y.o. who identifies as a female who was assigned female at birth, and is being seen today for evaluation of cough x 2.5 weeks.Associated with nasal congestion, sinus pressure, headaches.  Cough is mostly dry, intermittently productive, worse at night time. Tried mucinex, OTC cold medicine, tylenol  sinus, cough drop without significant improvement. No sick contacts. No concern for covid/flu. No hx of chronic bronchitis, PNA. Denies f/c/n/v/, ear pain, chest tightness, shortness of breath.     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   Dyspareunia 09/01/2013   Menorrhagia 08/12/2013   Dysmenorrhea 08/12/2013   Back pain 01/06/2011   Neck pain 01/06/2011   NICOTINE ADDICTION 12/28/2008   FATIGUE 12/28/2008   UNSPECIFIED URINARY INCONTINENCE 12/28/2008    Allergies:  Allergies  Allergen Reactions   Xanax [Alprazolam]     REACTION: migraine   Zoloft  [Sertraline  Hcl] Other (See Comments)    Did not feel right   Medications:  Current Outpatient Medications:    azithromycin  (ZITHROMAX ) 250 MG tablet, Take 2 tablets on day 1, then 1 tablet daily on days 2 through 5, Disp: 6 tablet, Rfl:  0   benzonatate  (TESSALON ) 100 MG capsule, Take 1 capsule (100 mg total) by mouth 3 (three) times daily as needed for up to 7 days for cough., Disp: 21 capsule, Rfl: 0   PARoxetine  (PAXIL ) 20 MG tablet, Take 1 tablet (20 mg total) by mouth every morning., Disp: 90 tablet, Rfl: 3  Observations/Objective: Patient appears to be in no acute distress.  appears comfortable No labored breathing. Speaking in full sentences Dry cough audible Speech is clear and coherent with logical content.  Patient is alert and oriented at baseline. e  Assessment and Plan: 1. Bronchitis (Primary) - azithromycin  (ZITHROMAX ) 250 MG tablet; Take 2 tablets on day 1, then 1 tablet daily on days 2 through 5  Dispense: 6 tablet; Refill: 0 - benzonatate  (TESSALON ) 100 MG capsule; Take 1 capsule (100 mg total) by mouth 3 (three) times daily as needed for up to 7 days for cough.  Dispense: 21 capsule; Refill: 0  2. Current smoker  Get rest and adequate sleep  Drink plenty of water, broth, and other clear fluids to stay hydrated.  Use a cool-mist humidifier  Elevate the head of the bed to help with post nasal drainage Sip warm liquids, gargle with salt water, use lozenges, or suck on hard candy.  Use over-the-counter medications like acetaminophen  (Tylenol ) or ibuprofen  (Advil , Motrin ) as needed for fever and pain Honey (for those over age 19) or cough drops can help alleviate cough symptoms.  Use saline nasal sprays or washes.  Over the counter mucinex, max strength ( blue and white box) to help loosen sinus congestion.     Follow Up Instructions: I discussed the assessment and treatment plan with the patient. The patient was provided an opportunity to ask questions and all were answered. The patient agreed with the plan and demonstrated an understanding of the instructions.  A copy of instructions were sent to the patient via MyChart unless otherwise noted below.    The patient was advised to call back or seek an  in-person evaluation if the symptoms worsen or if the condition fails to improve as anticipated.    Aashir Umholtz, PA-C

## 2023-11-24 NOTE — Patient Instructions (Signed)
 Anna Gonzalez, thank you for joining Shilpa Bushee, PA-C for today's virtual visit.  While this provider is not your primary care provider (PCP), if your PCP is located in our provider database this encounter information will be shared with them immediately following your visit.   A [facility] MyChart account gives you access to today's visit and all your visits, tests, and labs performed at [facility]  click here if you don't have a [facility] MyChart account or go to [email or web address]  Consent: (Patient) Anna Gonzalez provided verbal consent for this virtual visit at the beginning of the encounter.  Current Medications:  Current Outpatient Medications:    azithromycin  (ZITHROMAX ) 250 MG tablet, Take 2 tablets on day 1, then 1 tablet daily on days 2 through 5, Disp: 6 tablet, Rfl: 0   benzonatate  (TESSALON ) 100 MG capsule, Take 1 capsule (100 mg total) by mouth 3 (three) times daily as needed for up to 7 days for cough., Disp: 21 capsule, Rfl: 0   PARoxetine  (PAXIL ) 20 MG tablet, Take 1 tablet (20 mg total) by mouth every morning., Disp: 90 tablet, Rfl: 3   Medications ordered in this encounter:  Meds ordered this encounter  Medications   azithromycin  (ZITHROMAX ) 250 MG tablet    Sig: Take 2 tablets on day 1, then 1 tablet daily on days 2 through 5    Dispense:  6 tablet    Refill:  0    Supervising Provider:   LAMPTEY, PHILIP O [[id number]]   benzonatate  (TESSALON ) 100 MG capsule    Sig: Take 1 capsule (100 mg total) by mouth 3 (three) times daily as needed for up to 7 days for cough.    Dispense:  21 capsule    Refill:  0    Supervising Provider:   BLAISE ALEENE KIDD [[id number]]     *If you need refills on other medications prior to your next appointment, please contact your pharmacy*  Follow-Up: Call back or seek an in-person evaluation if the symptoms worsen or if the condition fails to improve as anticipated.  [facility] [phone]  Other Instructions Get rest and adequate sleep  Drink plenty of water, broth, and other clear fluids to stay hydrated.  Use a cool-mist humidifier  Elevate the head of the bed to help with post nasal drainage Sip warm liquids, gargle with salt water, use lozenges, or suck on hard candy.  Use over-the-counter medications like acetaminophen  (Tylenol ) or ibuprofen  (Advil , Motrin ) as needed for fever and pain Honey (for those over age 17) or cough drops can help alleviate cough symptoms.  Use saline nasal sprays or washes.  Over the counter mucinex, max strength ( blue and white box) to help loosen sinus congestion. Acute Bronchitis, Adult  Acute bronchitis is when air tubes in the lungs (bronchi) suddenly get swollen. The condition can make it hard for you to breathe. In adults, acute bronchitis usually goes away within 2 weeks. A cough caused by bronchitis may last up to 3 weeks. Smoking, allergies, and asthma can make the condition worse. What are the causes? Germs that cause cold and flu (viruses). The most common cause of this condition is the virus that causes the common cold. Bacteria. Substances that bother (irritate) the lungs, including: Smoke from cigarettes and other types of tobacco. Dust and pollen. Fumes from chemicals, gases, or burned fuel. Indoor or outdoor air pollution. What increases the risk? A weak body's defense system. This is also called  the immune system. Any condition that affects your lungs and breathing, such as asthma. What are the signs or symptoms? A cough. Coughing up clear, yellow, or green mucus. Making high-pitched whistling sounds when you breathe, most often when you breathe out (wheezing). Runny or stuffy nose. Having too much mucus in your lungs (chest congestion). Shortness of breath. Body aches. A sore throat. How is this treated? Acute bronchitis may go away over time without treatment. Your doctor may tell you to: Drink more  fluids. This will help thin your mucus so it is easier to cough up. Use a device that gets medicine into your lungs (inhaler). Use a vaporizer or a humidifier. These are machines that add water to the air. This helps with coughing and poor breathing. Take a medicine that thins mucus and helps clear it from your lungs. Take a medicine that prevents or stops coughing. It is not common to take an antibiotic medicine for this condition. Follow these instructions at home:  Take over-the-counter and prescription medicines only as told by your doctor. Use an inhaler, vaporizer, or humidifier as told by your doctor. Take two teaspoons (10 mL) of honey at bedtime. This helps lessen your coughing at night. Drink enough fluid to keep your pee (urine) pale yellow. Do not smoke or use any products that contain nicotine or tobacco. If you need help quitting, ask your doctor. Get a lot of rest. Return to your normal activities when your doctor says that it is safe. Keep all follow-up visits. How is this prevented?  Wash your hands often with soap and water for at least 20 seconds. If you cannot use soap and water, use hand sanitizer. Avoid contact with people who have cold symptoms. Try not to touch your mouth, nose, or eyes with your hands. Avoid breathing in smoke or chemical fumes. Make sure to get the flu shot every year. Contact a doctor if: Your symptoms do not get better in 2 weeks. You have trouble coughing up the mucus. Your cough keeps you awake at night. You have a fever. Get help right away if: You cough up blood. You have chest pain. You have very bad shortness of breath. You faint or keep feeling like you are going to faint. You have a very bad headache. Your fever or chills get worse. These symptoms may be an emergency. Get help right away. Call your local emergency services (911 in the [place] Do not wait to see if the symptoms will go away. Do not drive yourself to the  hospital. Summary Acute bronchitis is when air tubes in the lungs (bronchi) suddenly get swollen. In adults, acute bronchitis usually goes away within 2 weeks. Drink more fluids. This will help thin your mucus so it is easier to cough up. Take over-the-counter and prescription medicines only as told by your doctor. Contact a doctor if your symptoms do not improve after 2 weeks of treatment. This information is not intended to replace advice given to you by your health care provider. Make sure you discuss any questions you have with your health care provider. Document Revised: 07/11/2020 Document Reviewed: 07/11/2020 Elsevier Patient Education  2024 [employer]   If you have been instructed to have an in-person evaluation today at a local Urgent Care facility, please use the link below. It will take you to a list of all of our available [facility], including address, phone number and hours of operation. Please do not delay care.  [facility]  If you or a family member do not have a primary care provider, use the link below to schedule a visit and establish care. When you choose a [facility] primary care physician or advanced practice provider, you gain a long-term partner in health. Find a Primary Care Provider  Learn more about [facility]'s in-office and virtual care options: [facility] - Get Care Now

## 2024-04-06 ENCOUNTER — Ambulatory Visit (INDEPENDENT_AMBULATORY_CARE_PROVIDER_SITE_OTHER): Admitting: Nurse Practitioner

## 2024-04-06 ENCOUNTER — Encounter: Payer: Self-pay | Admitting: Nurse Practitioner

## 2024-04-06 VITALS — BP 112/78 | HR 78 | Ht 63.25 in | Wt 136.0 lb

## 2024-04-06 DIAGNOSIS — Z1331 Encounter for screening for depression: Secondary | ICD-10-CM | POA: Diagnosis not present

## 2024-04-06 DIAGNOSIS — Z01419 Encounter for gynecological examination (general) (routine) without abnormal findings: Secondary | ICD-10-CM

## 2024-04-06 NOTE — Progress Notes (Signed)
" ° °  Anna Gonzalez 11-02-1975 [id number]   History:  49 y.o. G4P0013 presents for annual exam. S/P hysterectomy with BSO for benign reasons. Normal pap history. Smoker. H/O urothelial cancer, sees urology annually.   Gynecologic History Patient's last menstrual period was 10/30/2018.   Contraception/Family planning: status post hysterectomy Sexually active: Yes  Health Maintenance Last Pap: 02/02/2019. Results were: Normal neg HPV Last mammogram: 05/25/2019. Results were: Normal Last colonoscopy: Never Last Dexa: Not indicated     04/06/2024    2:29 PM  Depression screen PHQ 2/9  Decreased Interest 0  Down, Depressed, Hopeless 0  PHQ - 2 Score 0     Past medical history, past surgical history, family history and social history were all reviewed and documented in the EPIC chart. Occupational hygienist at [employer]. 3 children, 4 grandchildren, local.   ROS:  A ROS was performed and pertinent positives and negatives are included.  Exam:  Vitals:   04/06/24 1425  BP: 112/78  Pulse: 78  SpO2: 99%  Weight: 136 lb (61.7 kg)  Height: 5' 3.25 (1.607 m)   Body mass index is 23.9 kg/m.  General appearance:  Normal Thyroid :  Symmetrical, normal in size, without palpable masses or nodularity. Respiratory  Auscultation:  Clear without wheezing or rhonchi Cardiovascular  Auscultation:  Regular rate, without rubs, murmurs or gallops  Edema/varicosities:  Not grossly evident Abdominal  Soft,nontender, without masses, guarding or rebound.  Liver/spleen:  No organomegaly noted  Hernia:  None appreciated  Skin  Inspection:  Grossly normal Breasts: Examined lying and sitting.   Right: Without masses, retractions, nipple discharge or axillary adenopathy.   Left: Without masses, retractions, nipple discharge or axillary adenopathy. Pelvic: External genitalia:  no lesions              Urethra:  normal appearing urethra with no masses, tenderness or lesions              Bartholins and  Skenes: normal                 Vagina: normal appearing vagina with normal color and discharge, no lesions              Cervix: no lesions Bimanual Exam:  Uterus:  no masses or tenderness              Adnexa: no mass, fullness, tenderness              Rectovaginal: Deferred              Anus:  normal, no lesions  Anna Gonzalez, CMA present as chaperone.   Assessment/Plan:  49 y.o. [id number] for annual exam.   Well female exam with routine gynecological exam - Education provided on SBEs, importance of preventative screenings, current guidelines, high calcium diet, regular exercise, and multivitamin daily. Labs with PCP.  Depression screening - PHQ - 0  Screening for cervical cancer - Normal Pap history.  No longer screening per guidelines.   Screening for breast cancer - H/O benign breast cysts. Overdue and encouraged to schedule.  Normal breast exam today.  Screening for colon cancer - Discussed current guidelines and importance of preventative screenings. Declines.   Screening for osteoporosis - Long-term smoker. Recommend DXA.   No follow-ups on file.   Anna DELENA Shutter DNP, 2:40 PM 04/06/2024 "
